# Patient Record
Sex: Male | Born: 1970 | Race: White | Hispanic: No | Marital: Single | Smoking: Current some day smoker
Health system: Southern US, Community
[De-identification: ages and names within clinical notes are randomized; demographics above are authoritative.]

## PROBLEM LIST (undated history)

## (undated) DIAGNOSIS — M1990 Unspecified osteoarthritis, unspecified site: Secondary | ICD-10-CM

## (undated) DIAGNOSIS — H9191 Unspecified hearing loss, right ear: Secondary | ICD-10-CM

## (undated) DIAGNOSIS — Z789 Other specified health status: Secondary | ICD-10-CM

## (undated) DIAGNOSIS — S46012A Strain of muscle(s) and tendon(s) of the rotator cuff of left shoulder, initial encounter: Secondary | ICD-10-CM

## (undated) DIAGNOSIS — Z87442 Personal history of urinary calculi: Secondary | ICD-10-CM

## (undated) DIAGNOSIS — M67814 Other specified disorders of tendon, left shoulder: Secondary | ICD-10-CM

## (undated) HISTORY — PX: SHOULDER OPEN ROTATOR CUFF REPAIR: SHX2407

## (undated) HISTORY — PX: KNEE ARTHROSCOPY: SHX127

---

## 2006-03-05 ENCOUNTER — Emergency Department: Payer: Self-pay | Admitting: Emergency Medicine

## 2006-03-05 IMAGING — CR LEFT MIDDLE FINGER 2+V
1 series · 3 of 3 positions shown · non-contrast
Comparison: none

REASON FOR EXAM: -mc4
COMMENTS:

PROCEDURE:     DXR - DXR FINGER MID 3RD DIGIT LT HAND  - [DATE]  [DATE]
RESULT:     No acute bony or joint abnormalities identified. There is no
evidence of fracture or dislocation.

[Series 1: view not recorded · 0.17mm/px · 3 of 3 slices shown]
[im 1/3]
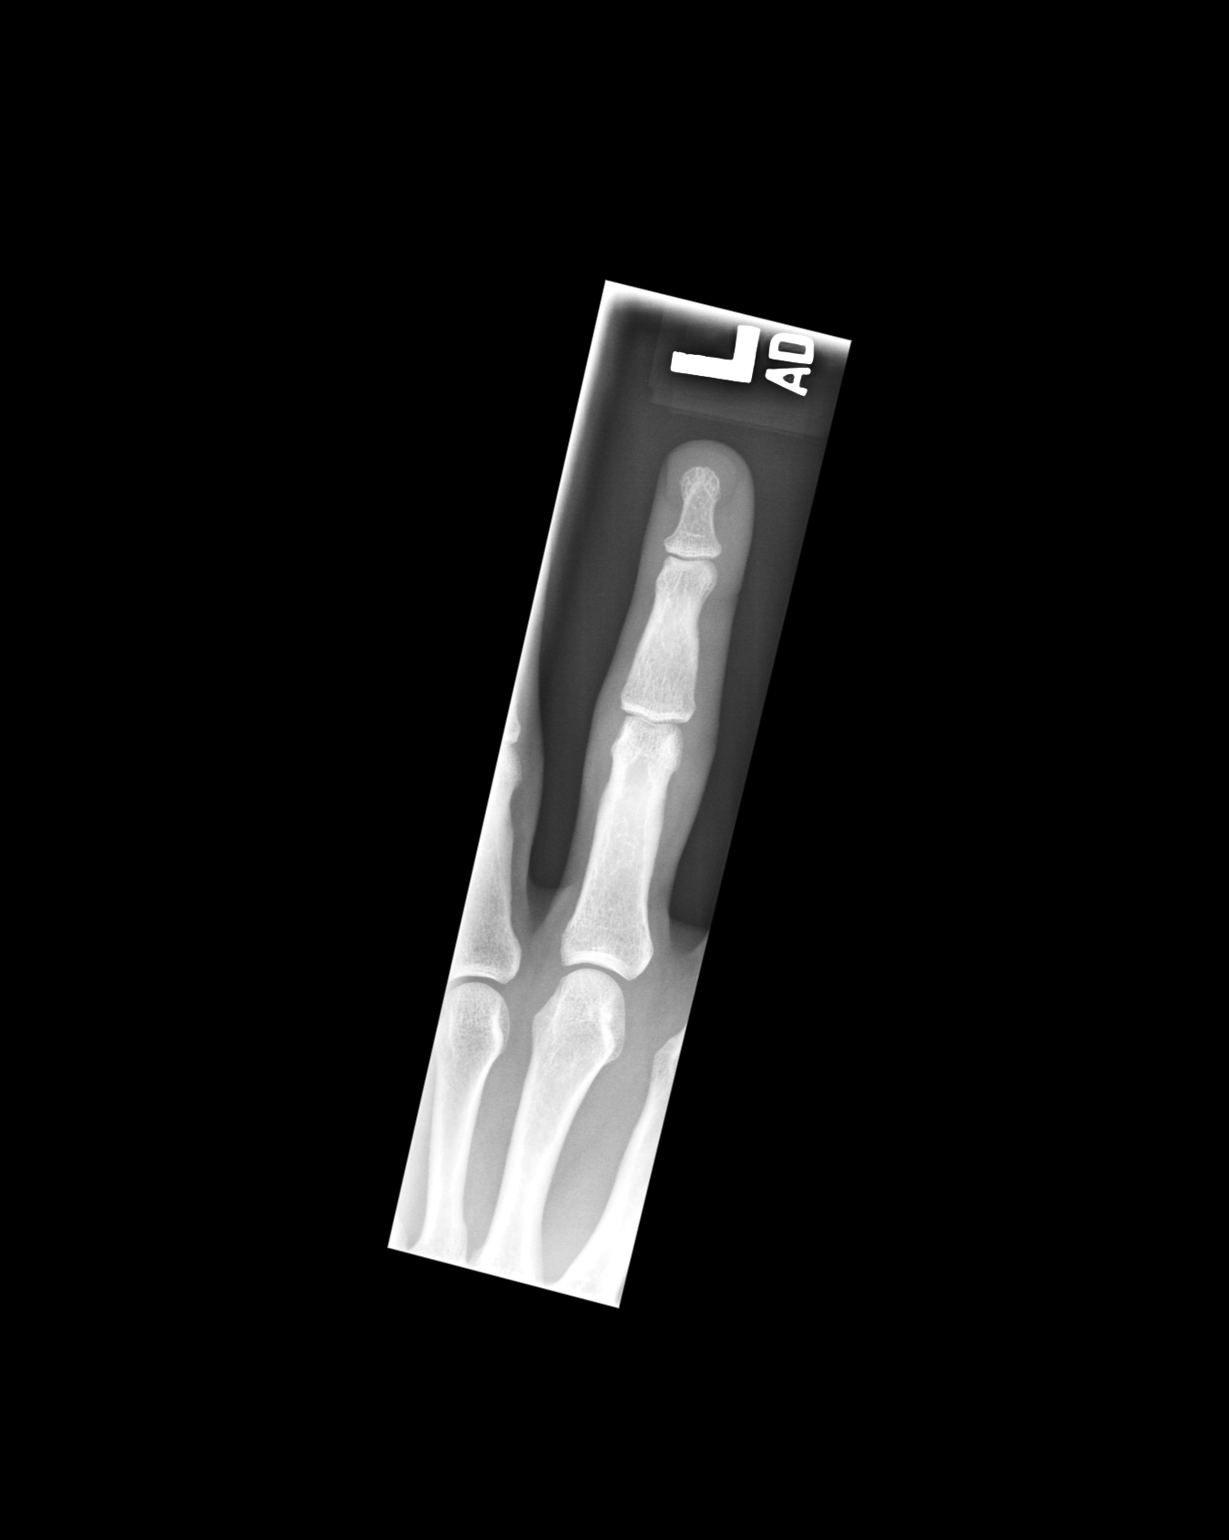
[im 2/3]
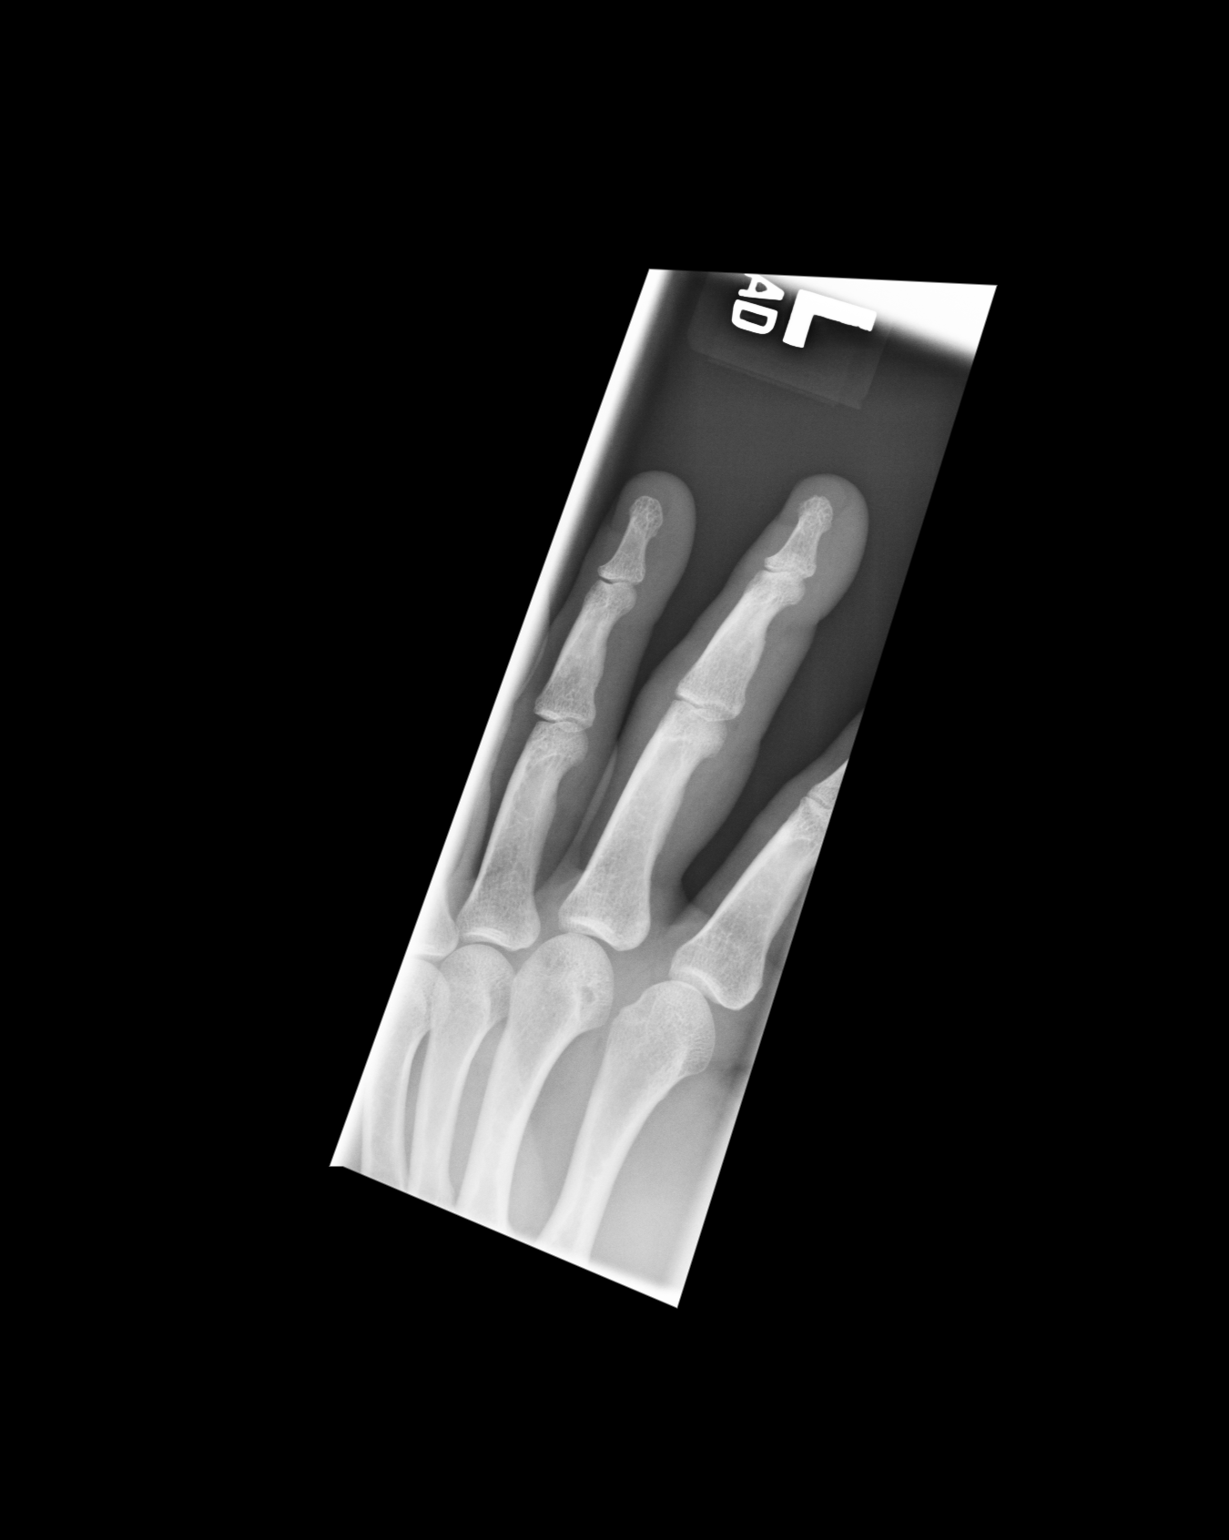
[im 3/3]
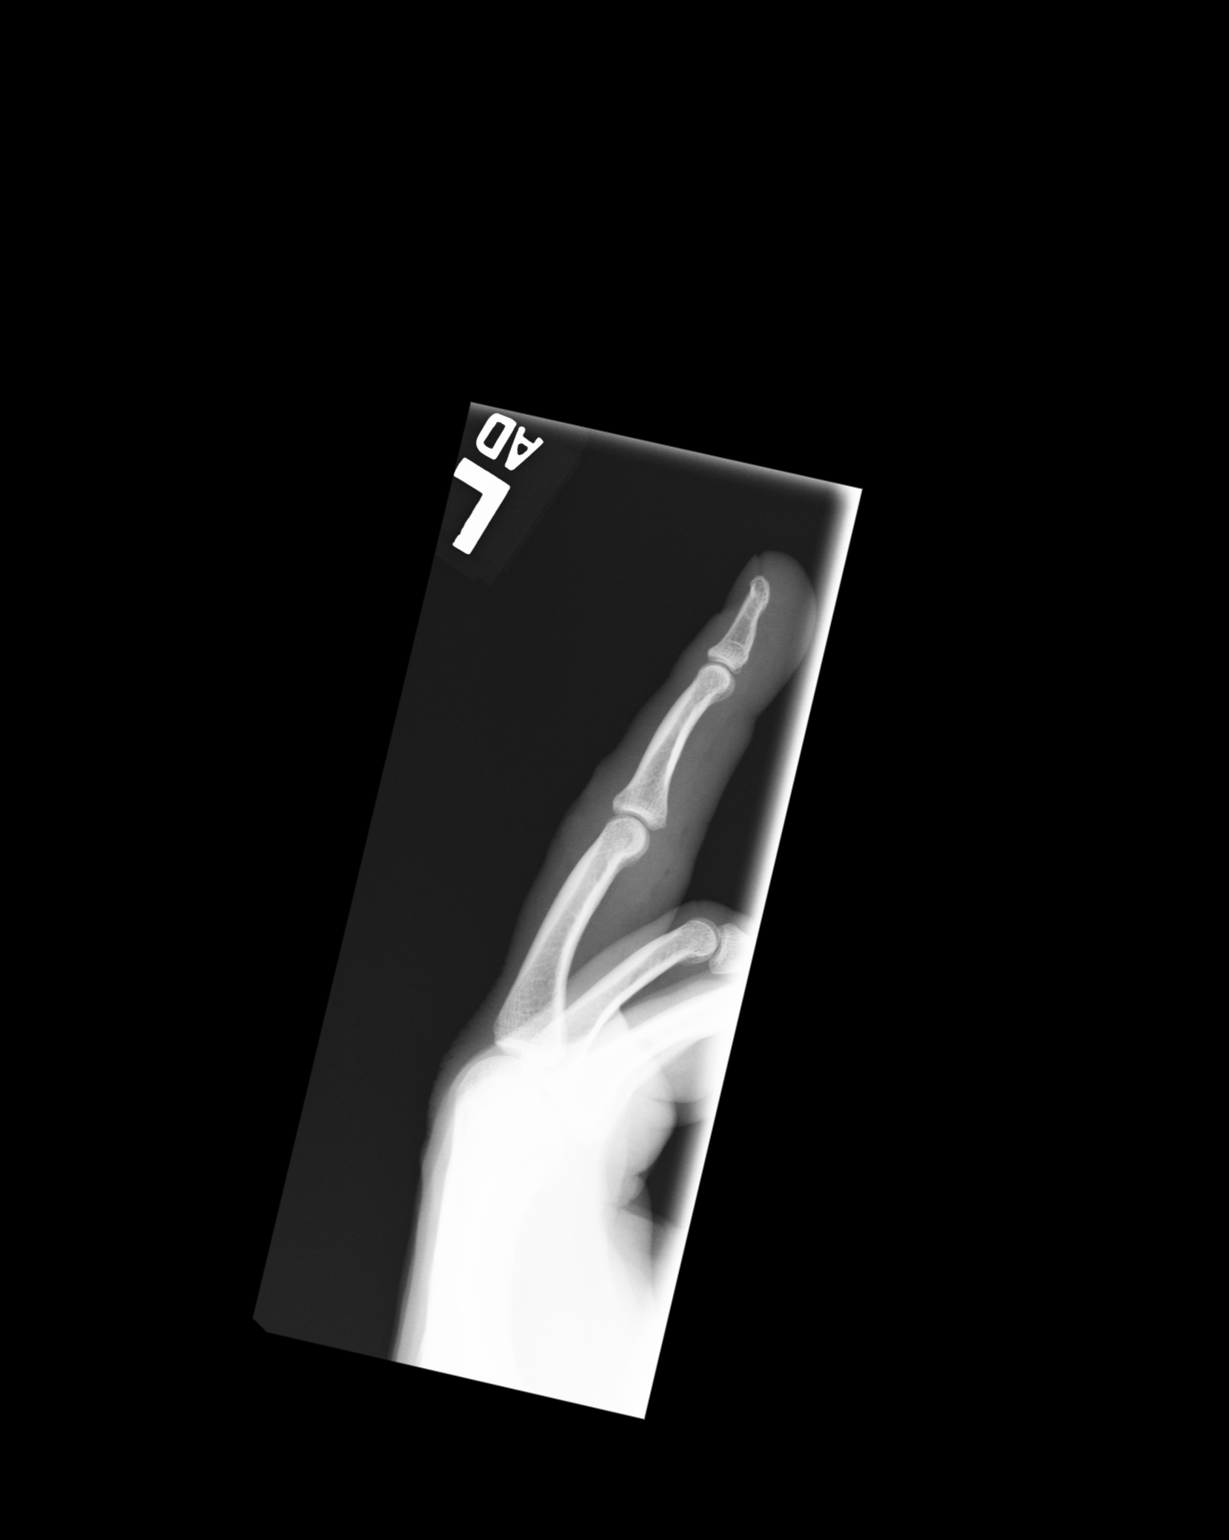

[3 of 3 positions shown; findings below may reference images not displayed]

IMPRESSION: 1)No acute abnormalities.

## 2014-06-26 ENCOUNTER — Emergency Department: Payer: Self-pay | Admitting: Emergency Medicine

## 2014-06-26 IMAGING — CT CT ABD-PELV W/ CM
2 of 5 series · 16 of 46 positions shown, 18 images · IV contrast (omnipaque)
Comparison: None.

CLINICAL DATA: Right flank pain and right lower quadrant pain

EXAM:
CT ABDOMEN AND PELVIS WITH CONTRAST
TECHNIQUE: Multidetector CT imaging of the abdomen and pelvis was performed
using the standard protocol following bolus administration of
intravenous contrast.
CONTRAST:  100 mL Omnipaque 300 intravenous

[Series 2: routine abd pel with · axial · 0.75mm/px · z∈[-550,-100]mm · 13 of 102 slices shown, 15 images]
[im 6/102  soft-tissue]
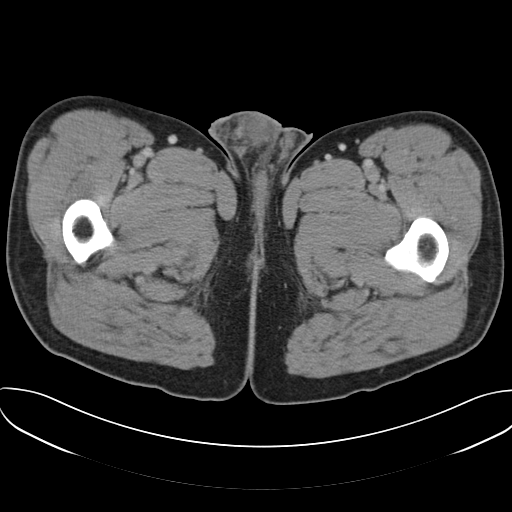
[im 6/102  bone]
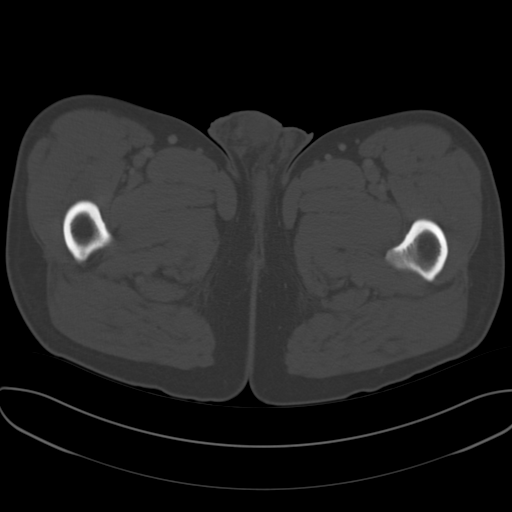
[im 16/102  soft-tissue]
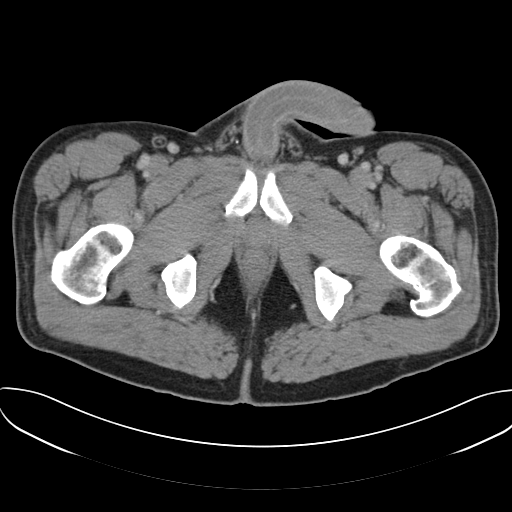
[im 22/102  soft-tissue]
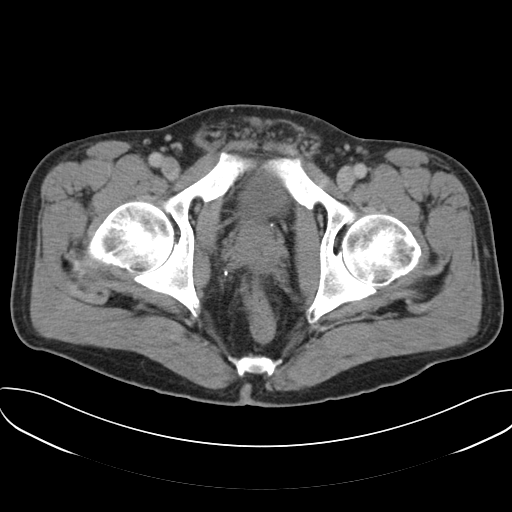
[im 27/102  soft-tissue]
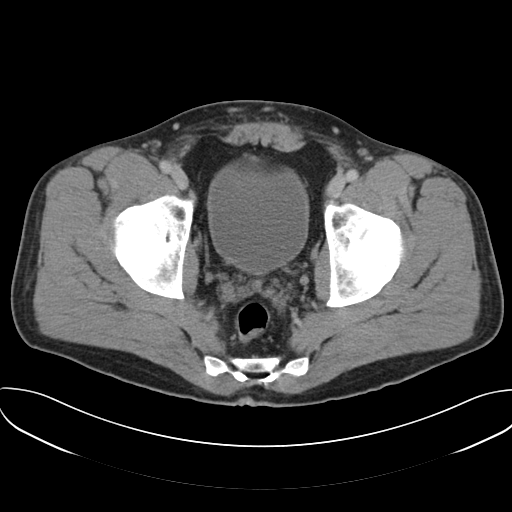
[im 38/102  soft-tissue]
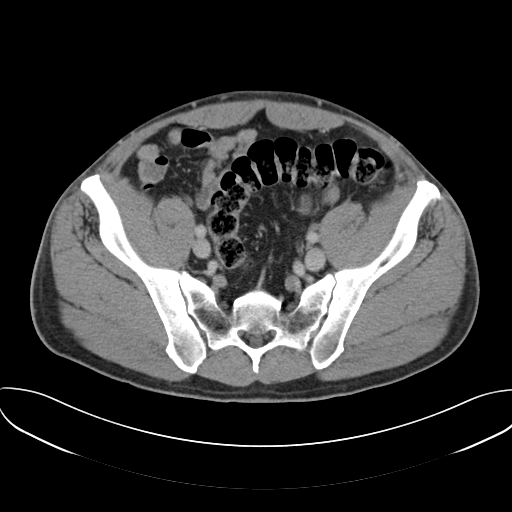
[im 43/102  soft-tissue]
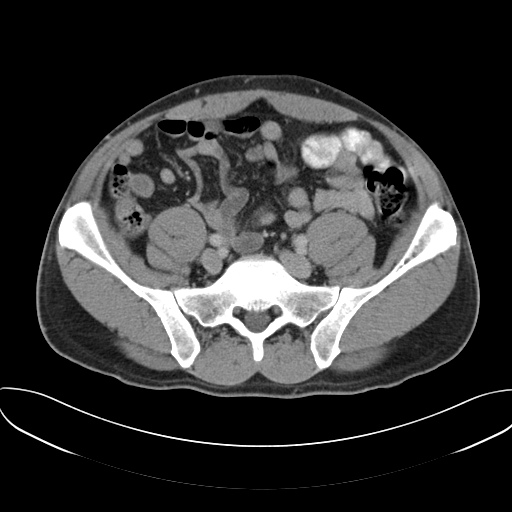
[im 54/102  soft-tissue]
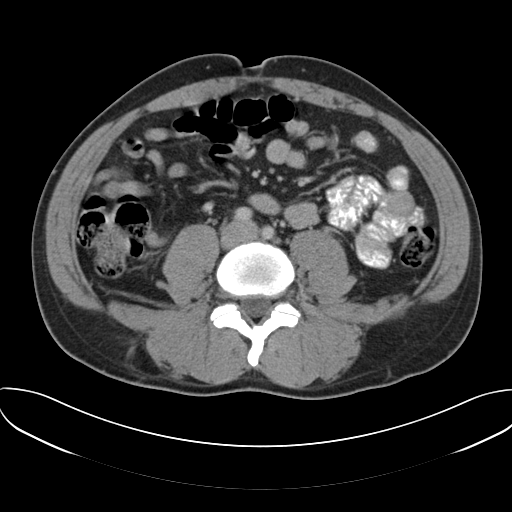
[im 59/102  soft-tissue]
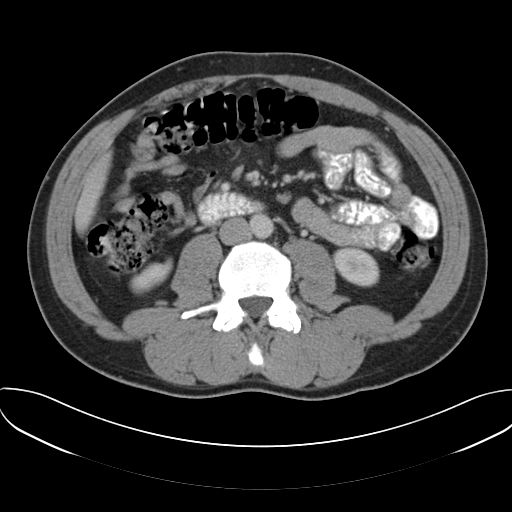
[im 64/102  soft-tissue]
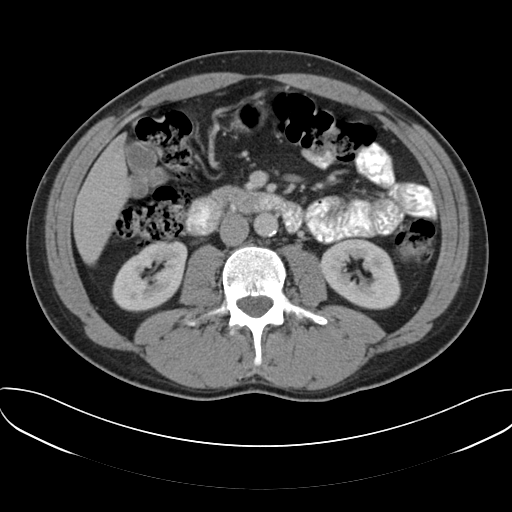
[im 64/102  bone]
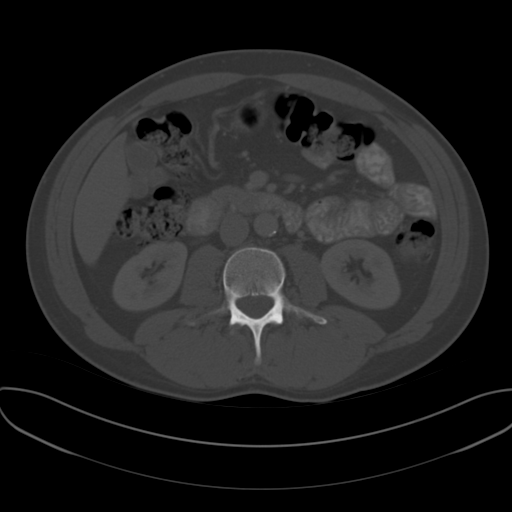
[im 75/102  soft-tissue]
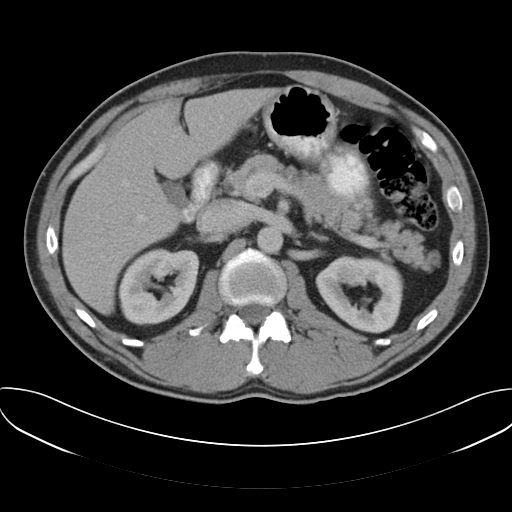
[im 80/102  soft-tissue]
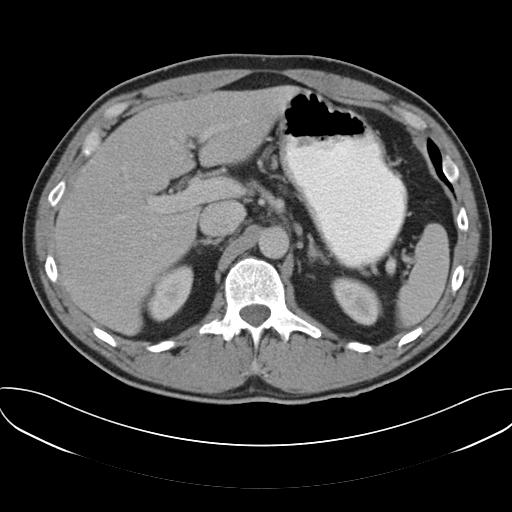
[im 86/102  soft-tissue]
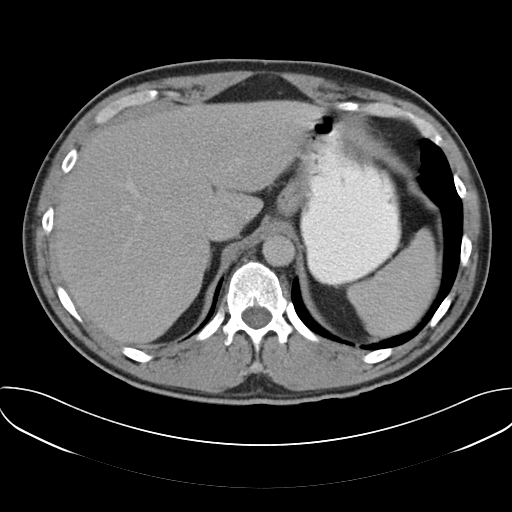
[im 96/102  soft-tissue]
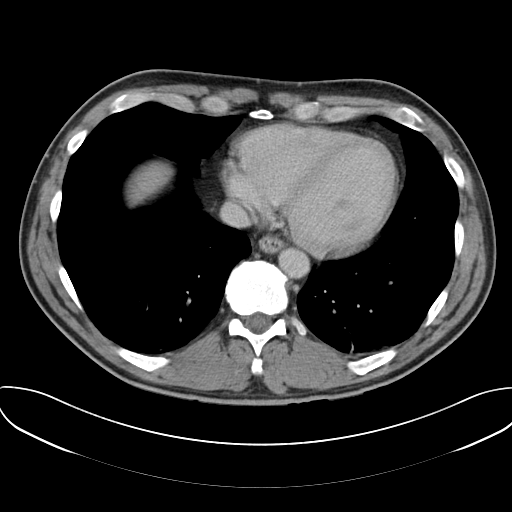

[Series 5: cor routine abd pel with · coronal · 0.72mm/px · 3 of 126 slices shown]
[im 42/126  soft-tissue]
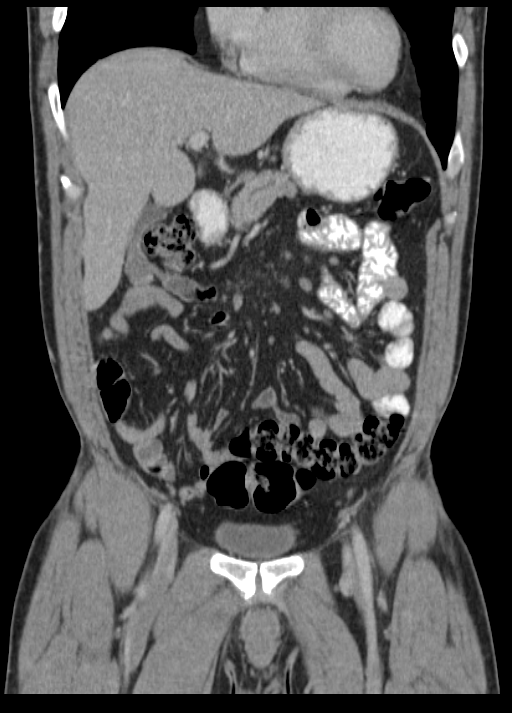
[im 56/126  soft-tissue]
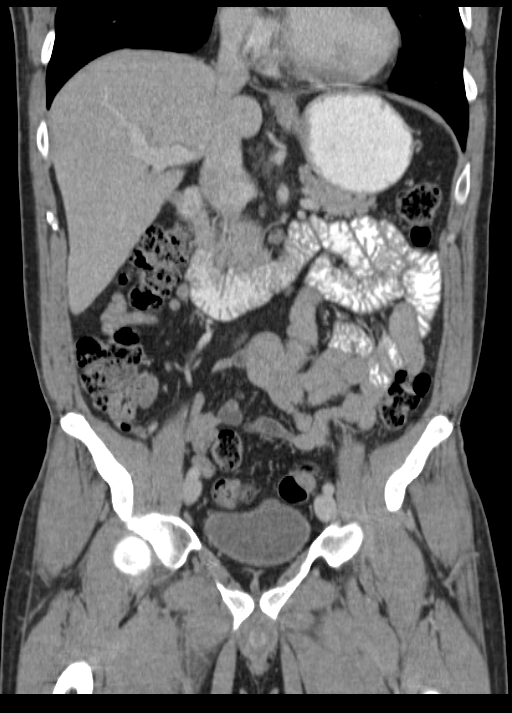
[im 70/126  soft-tissue]
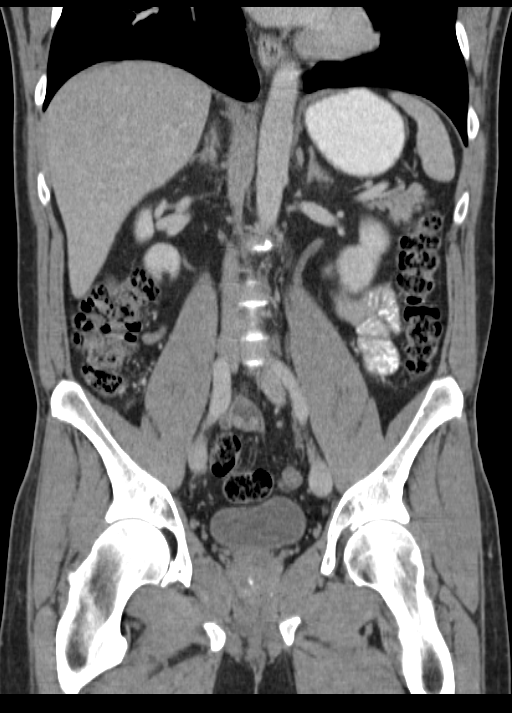

[16 of 46 positions shown; findings below may reference images not displayed]

FINDINGS: Lower chest: Mild linear scar-like opacity in the left posterior
base

Hepatobiliary: There are normal appearances of the liver,
gallbladder and bile ducts.

Pancreas: Normal

Spleen: Normal except for a sub cm cyst posterolaterally.

Adrenals/Urinary Tract: The adrenals and kidneys are normal in
appearance. There is no urinary calculus evident. There is no
hydronephrosis or ureteral dilatation. Collecting systems and
ureters appear unremarkable.

Stomach/Bowel: There are normal appearances of the stomach, small
bowel and colon. The appendix is normal.

Vascular/Lymphatic: The abdominal aorta is normal in caliber. There
is no atherosclerotic calcification. There is no adenopathy in the
abdomen or pelvis.

Other: No acute inflammatory changes are evident in the abdomen or
pelvis. There is no ascites.

Musculoskeletal: No significant abnormality
IMPRESSION: No significant abnormality

## 2016-03-25 ENCOUNTER — Other Ambulatory Visit: Payer: Self-pay | Admitting: Specialist

## 2016-03-28 ENCOUNTER — Encounter
Admission: RE | Admit: 2016-03-28 | Discharge: 2016-03-28 | Disposition: A | Discharge status: Home / Self Care | Payer: BLUE CROSS/BLUE SHIELD | Source: Ambulatory Visit | Attending: Specialist | Admitting: Specialist

## 2016-03-28 ENCOUNTER — Encounter: Payer: Self-pay | Admitting: *Deleted

## 2016-03-28 NOTE — Patient Instructions (Signed)
  Your procedure is scheduled on: 03-31-16 Report to Same Day Surgery 2nd floor medical mall [facility](Medical Mall Entrance-take elevator on left to 2nd floor.  Check in with surgery information desk.) To find out your arrival time please call [phone] between 1PM - 3PM on 03-28-16  Remember: Instructions that are not followed completely may result in serious medical risk, up to and including death, or upon the discretion of your surgeon and anesthesiologist your surgery may need to be rescheduled.    _x___ 1. Do not eat food or drink liquids after midnight. No gum chewing or hard candies.     __x__ 2. No Alcohol for 24 hours before or after surgery.   __x__3. No Smoking for 24 prior to surgery.   ____  4. Bring all medications with you on the day of surgery if instructed.    __x__ 5. Notify your doctor if there is any change in your medical condition     (cold, fever, infections).     Do not wear jewelry, make-up, hairpins, clips or nail polish.  Do not wear lotions, powders, or perfumes. You may wear deodorant.  Do not shave 48 hours prior to surgery. Men may shave face and neck.  Do not bring valuables to the hospital.    [facility] belongings or valuables.               Contacts, dentures or bridgework may not be worn into surgery.  Leave your suitcase in the car. After surgery it may be brought to your room.  For patients admitted to the hospital, discharge time is determined by your treatment team.   Patients discharged the day of surgery will not be allowed to drive home.  You will need someone to drive you home and stay with you the night of your procedure.    Please read over the following fact sheets that you were given:   [facility] Preparing [facility] and or MRSA Information   ____ Take these medicines the morning of surgery with A SIP OF WATER:    1. NONE  2.  3.  4.  5.  6.  ____Fleets enema or Magnesium Citrate as directed.   ____  Use CHG Soap or sage wipes as directed on instruction sheet   ____ Use inhalers on the day of surgery and bring to hospital day of surgery  ____ Stop metformin 2 days prior to surgery    ____ Take 1/2 of usual insulin dose the night before surgery and none on the morning of  surgery.   ____ Stop Aspirin, Coumadin, Pllavix ,Eliquis, Effient, or Pradaxa  x__ Stop Anti-inflammatories such as Advil, Aleve, Ibuprofen, Motrin, Naproxen,          Naprosyn, Goodies powders or aspirin products. Ok to take Tylenol.   ____ Stop supplements until after surgery.    ____ Bring C-Pap to the hospital.

## 2016-03-31 ENCOUNTER — Encounter: Payer: Self-pay | Admitting: *Deleted

## 2016-03-31 ENCOUNTER — Ambulatory Visit: Payer: BLUE CROSS/BLUE SHIELD | Admitting: Certified Registered Nurse Anesthetist

## 2016-03-31 ENCOUNTER — Encounter
Admission: RE | Disposition: A | Discharge status: Home / Self Care | Payer: Self-pay | Source: Ambulatory Visit | Attending: Specialist

## 2016-03-31 ENCOUNTER — Ambulatory Visit
Admission: RE | Admit: 2016-03-31 | Discharge: 2016-03-31 | Disposition: A | Discharge status: Home / Self Care | Payer: BLUE CROSS/BLUE SHIELD | Source: Ambulatory Visit | Attending: Specialist | Admitting: Specialist

## 2016-03-31 DIAGNOSIS — F17210 Nicotine dependence, cigarettes, uncomplicated: Secondary | ICD-10-CM | POA: Insufficient documentation

## 2016-03-31 DIAGNOSIS — M7701 Medial epicondylitis, right elbow: Secondary | ICD-10-CM | POA: Diagnosis not present

## 2016-03-31 HISTORY — PX: TENNIS ELBOW RELEASE/NIRSCHEL PROCEDURE: SHX6651

## 2016-03-31 HISTORY — DX: Other specified health status: Z78.9

## 2016-03-31 SURGERY — TENNIS ELBOW RELEASE/NIRSCHEL PROCEDURE
Anesthesia: General | Site: Elbow | Laterality: Right | Wound class: Clean

## 2016-03-31 MED ORDER — ACETAMINOPHEN 10 MG/ML IV SOLN
INTRAVENOUS | Status: DC | PRN
  Administered 2016-03-31: 1000 mg via INTRAVENOUS

## 2016-03-31 MED ORDER — FAMOTIDINE 20 MG PO TABS
20.0000 mg | ORAL_TABLET | Freq: Once | ORAL | Status: AC
  Administered 2016-03-31: 20 mg via ORAL

## 2016-03-31 MED ORDER — LACTATED RINGERS IV SOLN
INTRAVENOUS | Status: DC
  Administered 2016-03-31: 16:00:00 via INTRAVENOUS

## 2016-03-31 MED ORDER — MIDAZOLAM HCL 2 MG/2ML IJ SOLN
INTRAMUSCULAR | Status: DC | PRN
  Administered 2016-03-31: 2 mg via INTRAVENOUS

## 2016-03-31 MED ORDER — MELOXICAM 7.5 MG PO TABS
15.0000 mg | ORAL_TABLET | Freq: Once | ORAL | Status: AC
  Administered 2016-03-31: 15 mg via ORAL

## 2016-03-31 MED ORDER — MELOXICAM 7.5 MG PO TABS
ORAL_TABLET | ORAL | Status: AC
  Filled 2016-03-31: qty 2

## 2016-03-31 MED ORDER — PROMETHAZINE HCL 25 MG/ML IJ SOLN
6.2500 mg | INTRAMUSCULAR | Status: DC | PRN
Start: 2016-03-31 — End: 2016-03-31

## 2016-03-31 MED ORDER — NEOMYCIN-POLYMYXIN B GU 40-200000 IR SOLN
Status: AC
  Filled 2016-03-31: qty 2

## 2016-03-31 MED ORDER — CHLORHEXIDINE GLUCONATE CLOTH 2 % EX PADS: 6.0000 | MEDICATED_PAD | Freq: Once | CUTANEOUS | Status: DC

## 2016-03-31 MED ORDER — CLINDAMYCIN PHOSPHATE 600 MG/50ML IV SOLN
INTRAVENOUS | Status: AC
  Filled 2016-03-31: qty 50

## 2016-03-31 MED ORDER — BUPIVACAINE HCL (PF) 0.5 % IJ SOLN
INTRAMUSCULAR | Status: DC | PRN
  Administered 2016-03-31: 30 mL

## 2016-03-31 MED ORDER — CEFAZOLIN SODIUM-DEXTROSE 2-4 GM/100ML-% IV SOLN
INTRAVENOUS | Status: AC
  Filled 2016-03-31: qty 100

## 2016-03-31 MED ORDER — GABAPENTIN 400 MG PO CAPS
ORAL_CAPSULE | ORAL | Status: AC
  Filled 2016-03-31: qty 1

## 2016-03-31 MED ORDER — FAMOTIDINE 20 MG PO TABS
ORAL_TABLET | ORAL | Status: AC
  Filled 2016-03-31: qty 1

## 2016-03-31 MED ORDER — BUPIVACAINE HCL (PF) 0.5 % IJ SOLN
INTRAMUSCULAR | Status: AC
  Filled 2016-03-31: qty 30

## 2016-03-31 MED ORDER — ACETAMINOPHEN 10 MG/ML IV SOLN
INTRAVENOUS | Status: AC
  Filled 2016-03-31: qty 100

## 2016-03-31 MED ORDER — FENTANYL CITRATE (PF) 100 MCG/2ML IJ SOLN
INTRAMUSCULAR | Status: DC | PRN
  Administered 2016-03-31 (×2): 50 ug via INTRAVENOUS

## 2016-03-31 MED ORDER — FENTANYL CITRATE (PF) 100 MCG/2ML IJ SOLN: 25.0000 ug | INTRAMUSCULAR | Status: DC | PRN

## 2016-03-31 MED ORDER — PROPOFOL 10 MG/ML IV BOLUS
INTRAVENOUS | Status: DC | PRN
  Administered 2016-03-31: 200 mg via INTRAVENOUS

## 2016-03-31 MED ORDER — GABAPENTIN 400 MG PO CAPS: 400.0000 mg | ORAL_CAPSULE | Freq: Two times a day (BID) | ORAL | 3 refills | Status: DC

## 2016-03-31 MED ORDER — CLINDAMYCIN PHOSPHATE 600 MG/50ML IV SOLN
600.0000 mg | INTRAVENOUS | Status: AC
  Administered 2016-03-31: 600 mg via INTRAVENOUS

## 2016-03-31 MED ORDER — MEPERIDINE HCL 25 MG/ML IJ SOLN: 6.2500 mg | INTRAMUSCULAR | Status: DC | PRN

## 2016-03-31 MED ORDER — OXYCODONE HCL 5 MG PO TABS: 5.0000 mg | ORAL_TABLET | Freq: Once | ORAL | Status: DC | PRN

## 2016-03-31 MED ORDER — NEOMYCIN-POLYMYXIN B GU 40-200000 IR SOLN
Status: DC | PRN
  Administered 2016-03-31: 2 mL

## 2016-03-31 MED ORDER — CEFAZOLIN SODIUM-DEXTROSE 2-4 GM/100ML-% IV SOLN
2.0000 g | INTRAVENOUS | Status: AC
  Administered 2016-03-31: 2 g via INTRAVENOUS

## 2016-03-31 MED ORDER — LIDOCAINE HCL (CARDIAC) 20 MG/ML IV SOLN
INTRAVENOUS | Status: DC | PRN
  Administered 2016-03-31: 50 mg via INTRAVENOUS

## 2016-03-31 MED ORDER — OXYCODONE HCL 5 MG/5ML PO SOLN: 5.0000 mg | Freq: Once | ORAL | Status: DC | PRN

## 2016-03-31 MED ORDER — GABAPENTIN 400 MG PO CAPS
400.0000 mg | ORAL_CAPSULE | Freq: Once | ORAL | Status: AC
  Administered 2016-03-31: 400 mg via ORAL

## 2016-03-31 MED ORDER — ONDANSETRON HCL 4 MG/2ML IJ SOLN
INTRAMUSCULAR | Status: DC | PRN
  Administered 2016-03-31: 4 mg via INTRAVENOUS

## 2016-03-31 MED ORDER — HYDROCODONE-ACETAMINOPHEN 7.5-325 MG PO TABS: 1.0000 | ORAL_TABLET | Freq: Four times a day (QID) | ORAL | 0 refills | Status: DC | PRN

## 2016-03-31 MED ORDER — MELOXICAM 15 MG PO TABS: 15.0000 mg | ORAL_TABLET | Freq: Every day | ORAL | 3 refills | Status: DC

## 2016-03-31 MED ORDER — DEXAMETHASONE SODIUM PHOSPHATE 10 MG/ML IJ SOLN
INTRAMUSCULAR | Status: DC | PRN
  Administered 2016-03-31: 10 mg via INTRAVENOUS

## 2016-03-31 SURGICAL SUPPLY — 34 items
BNDG COHESIVE 4X5 TAN STRL (GAUZE/BANDAGES/DRESSINGS) ×3 IMPLANT
BNDG ESMARK 4X12 TAN STRL LF (GAUZE/BANDAGES/DRESSINGS) ×3 IMPLANT
CANISTER SUCT 1200ML W/VALVE (MISCELLANEOUS) ×3 IMPLANT
CHLORAPREP W/TINT 26ML (MISCELLANEOUS) ×3 IMPLANT
CLOSURE WOUND 1/2 X4 (GAUZE/BANDAGES/DRESSINGS) ×1
ELECT REM PT RETURN 9FT ADLT (ELECTROSURGICAL) ×3
ELECTRODE REM PT RTRN 9FT ADLT (ELECTROSURGICAL) ×1 IMPLANT
GAUZE PETRO XEROFOAM 1X8 (MISCELLANEOUS) ×3 IMPLANT
GAUZE SPONGE 4X4 12PLY STRL (GAUZE/BANDAGES/DRESSINGS) ×3 IMPLANT
GLOVE BIO SURGEON STRL SZ8 (GLOVE) ×3 IMPLANT
KIT RM TURNOVER STRD PROC AR (KITS) ×3 IMPLANT
LOOP VESSEL SUPERMAXI WHITE (MISCELLANEOUS) ×3 IMPLANT
NDL MAYO CATGUT SZ5 (NEEDLE)
NDL SUT 5 .5 CRC TROC PNT MAYO (NEEDLE) IMPLANT
NEEDLE SPNL 20GX3.5 QUINCKE YW (NEEDLE) ×3 IMPLANT
NS IRRIG 500ML POUR BTL (IV SOLUTION) ×3 IMPLANT
PACK EXTREMITY ARMC (MISCELLANEOUS) ×3 IMPLANT
PADDING CAST 4IN STRL (MISCELLANEOUS) ×4
PADDING CAST BLEND 4X4 STRL (MISCELLANEOUS) ×2 IMPLANT
SLING ARM LRG DEEP (SOFTGOODS) ×3 IMPLANT
SPLINT CAST 1 STEP 4X30 (MISCELLANEOUS) ×3 IMPLANT
SPONGE LAP 18X18 5 PK (GAUZE/BANDAGES/DRESSINGS) ×3 IMPLANT
STAPLER SKIN PROX 35W (STAPLE) ×3 IMPLANT
STOCKINETTE BIAS CUT 6 980064 (GAUZE/BANDAGES/DRESSINGS) ×3 IMPLANT
STOCKINETTE IMPERVIOUS 9X36 MD (GAUZE/BANDAGES/DRESSINGS) ×3 IMPLANT
STRIP CLOSURE SKIN 1/2X4 (GAUZE/BANDAGES/DRESSINGS) ×2 IMPLANT
SUT ETHIBOND CT1 BRD #0 30IN (SUTURE) IMPLANT
SUT ETHILON 4-0 (SUTURE)
SUT ETHILON 4-0 FS2 18XMFL BLK (SUTURE)
SUT PROLENE 3 0 PS 2 (SUTURE) IMPLANT
SUT VIC AB 2-0 CT1 (SUTURE) IMPLANT
SUT VIC AB 3-0 SH 27 (SUTURE)
SUT VIC AB 3-0 SH 27X BRD (SUTURE) IMPLANT
SUTURE ETHLN 4-0 FS2 18XMF BLK (SUTURE) IMPLANT

## 2016-03-31 NOTE — OR Nursing (Signed)
Clipper prep prep Frances MaywoodM Bennett mid upper to mid lower right arm as per Dr. Rondel BatonMiller's instructions.  Order for attestation for blood products cancelled as instructed by Dr. Hyacinth MeekerMiller.

## 2016-03-31 NOTE — Anesthesia Procedure Notes (Signed)
Procedure Name: LMA Insertion Date/Time: 03/31/2016 5:10 PM Performed by: Ginger CarneMICHELET, Wyatt Galvan Pre-anesthesia Checklist: Patient identified, Emergency Drugs available, Suction available, Patient being monitored and Timeout performed Patient Re-evaluated:Patient Re-evaluated prior to inductionOxygen Delivery Method: Circle system utilized Preoxygenation: Pre-oxygenation with 100% oxygen Intubation Type: IV induction LMA: LMA inserted LMA Size: 4.5 Tube type: Oral Number of attempts: 1 Placement Confirmation: positive ETCO2 and breath sounds checked- equal and bilateral Tube secured with: Tape Dental Injury: Teeth and Oropharynx as per pre-operative assessment

## 2016-03-31 NOTE — Op Note (Signed)
03/31/2016  5:59 PM  PATIENT:  Todd Hancock  45 y.o. male  PRE-OPERATIVE DIAGNOSIS:  M77.01 Medial epicondylitis, right elbow  POST-OPERATIVE DIAGNOSIS:  Same  PROCEDURE:   MEDIAL RELEASE RIGHT ELBOW  SURGEON:  Keeon Zurn E Clancey Welton MD  ASST:    ANESTHESIA:   General  EBL:  NONE  TOURNIQUET TIME:  31 MIN  OPERATIVE FINDINGS:  GRANULATION TISSUE AT FCR AND PRONATOR ORIGIN  OPERATIVE PROCEDURE:  The patient was brought to the operating room and underwent satisfactory general LMA anesthesia in the supine position. The arm was prepped and draped in a sterile fashion and Esmarch applied. Tourniquet was inflated to 250 mmHg. Slightly curved the posterior medial incision was made just behind the upper condyle. Dissection was carried on blood was changed tissue. Some changes veins and nerves were protected. Medial epicondyle region was opened. The interval between the radiologist and pronator was opened. This was followed proximally to the medial epicondyle. There was a large quantity of gray, shiny, granulation tissue in this area. This was excised sharply. This was carried out onto the medial epicondyle itself. A ronguer was used to remove bone from the  epicondyle. The wound was irrigated and the fascia closed with 2-0 Vicryl suture. The ulnar nerve did not appear to be under any undue tension and move freely to palpation. I elected not to decompress this. The subcutaneous tissue was closed with 3-0 Vicryl and the skin was closed with staples. 1/2% Marcaine was placed in the wound. Dry sterile dressing were applied along with a sugar tong splint. Tourniquet was deflated with good return of blood flow to the hand. Patient was awakened and taken to recovery in good condition.  Valinda HoarHOWARD E Myles Mallicoat, MD

## 2016-03-31 NOTE — Transfer of Care (Signed)
Immediate Anesthesia Transfer of Care Note  Patient: Todd Hancock C Vankuren  Procedure(s) Performed: Procedure(s): TENNIS ELBOW RELEASE/NIRSCHEL PROCEDURE (Right)  Patient Location: PACU  Anesthesia Type:General  Level of Consciousness: sedated  Airway & Oxygen Therapy: Patient Spontanous Breathing and Patient connected to face mask oxygen  Post-op Assessment: Report given to RN and Post -op Vital signs reviewed and stable  Post vital signs: Reviewed and stable  Last Vitals:  Vitals:   03/31/16 1540  BP: (!) 145/102  Pulse: 69  Resp: 18  Temp: 36.9 C    Last Pain:  Vitals:   03/31/16 1540  TempSrc: Tympanic         Complications: No apparent anesthesia complications

## 2016-03-31 NOTE — H&P (Signed)
THE PATIENT WAS SEEN PRIOR TO SURGERY TODAY.  HISTORY, ALLERGIES, HOME MEDICATIONS AND OPERATIVE PROCEDURE WERE REVIEWED. RISKS AND BENEFITS OF SURGERY DISCUSSED WITH PATIENT AGAIN.  NO CHANGES FROM INITIAL HISTORY AND PHYSICAL NOTED.    

## 2016-03-31 NOTE — Anesthesia Postprocedure Evaluation (Signed)
Anesthesia Post Note  Patient: Todd Hancock  Procedure(s) Performed: Procedure(s) (LRB): TENNIS ELBOW RELEASE/NIRSCHEL PROCEDURE (Right)  Patient location during evaluation: PACU Anesthesia Type: General Level of consciousness: awake and alert and oriented Pain management: pain level controlled Vital Signs Assessment: post-procedure vital signs reviewed and stable Respiratory status: spontaneous breathing, nonlabored ventilation and respiratory function stable Cardiovascular status: blood pressure returned to baseline and stable Postop Assessment: no signs of nausea or vomiting Anesthetic complications: no     Last Vitals:  Vitals:   03/31/16 1821 03/31/16 1836  BP: (!) 143/104 (!) 152/98  Pulse: 70 65  Resp: 16 20  Temp:      Last Pain:  Vitals:   03/31/16 1836  TempSrc:   PainSc: 0-No pain                 Florina Glas

## 2016-03-31 NOTE — OR Nursing (Signed)
abx x 2 to be given in OR No ord for clipper prep - Frances MaywoodM Bennett, RN will ask MD when in to see pt No attestation for procedure, just blood, Frances MaywoodM Bennett, RN will confirm with MD when in to see pt and will cx attestation for blood if need be, and ask MD to to do attestation for procedure.

## 2016-03-31 NOTE — Anesthesia Preprocedure Evaluation (Signed)
Anesthesia Evaluation  Patient identified by MRN, date of birth, ID band Patient awake    Reviewed: Allergy & Precautions, NPO status , Patient's Chart, lab work & pertinent test results  History of Anesthesia Complications Negative for: history of anesthetic complications  Airway Mallampati: III  TM Distance: >3 FB Neck ROM: Full    Dental  (+) Poor Dentition   Pulmonary neg sleep apnea, neg COPD, Current Smoker,    breath sounds clear to auscultation- rhonchi (-) wheezing      Cardiovascular Exercise Tolerance: Good (-) hypertension(-) CAD and (-) Past MI  Rhythm:Regular Rate:Normal - Systolic murmurs and - Diastolic murmurs    Neuro/Psych negative neurological ROS  negative psych ROS   GI/Hepatic negative GI ROS, Neg liver ROS,   Endo/Other  negative endocrine ROSneg diabetes  Renal/GU negative Renal ROS     Musculoskeletal negative musculoskeletal ROS (+)   Abdominal (+) - obese,   Peds  Hematology negative hematology ROS (+)   Anesthesia Other Findings    Reproductive/Obstetrics                             Anesthesia Physical Anesthesia Plan  ASA: II  Anesthesia Plan: General   Post-op Pain Management:    Induction: Intravenous  Airway Management Planned: LMA  Additional Equipment:   Intra-op Plan:   Post-operative Plan:   Informed Consent: I have reviewed the patients History and Physical, chart, labs and discussed the procedure including the risks, benefits and alternatives for the proposed anesthesia with the patient or authorized representative who has indicated his/her understanding and acceptance.   Dental advisory given  Plan Discussed with: CRNA and Anesthesiologist  Anesthesia Plan Comments:         Anesthesia Quick Evaluation

## 2016-03-31 NOTE — Discharge Instructions (Signed)

## 2016-04-01 ENCOUNTER — Encounter: Payer: Self-pay | Admitting: Specialist

## 2018-08-29 ENCOUNTER — Encounter: Payer: Self-pay | Admitting: Emergency Medicine

## 2018-08-29 ENCOUNTER — Emergency Department: Payer: BLUE CROSS/BLUE SHIELD

## 2018-08-29 ENCOUNTER — Other Ambulatory Visit: Payer: Self-pay

## 2018-08-29 ENCOUNTER — Emergency Department
Admission: EM | Admit: 2018-08-29 | Discharge: 2018-08-29 | Disposition: A | Discharge status: Home / Self Care | Payer: BLUE CROSS/BLUE SHIELD | Attending: Emergency Medicine | Admitting: Emergency Medicine

## 2018-08-29 DIAGNOSIS — Y9389 Activity, other specified: Secondary | ICD-10-CM | POA: Diagnosis not present

## 2018-08-29 DIAGNOSIS — S61511A Laceration without foreign body of right wrist, initial encounter: Secondary | ICD-10-CM | POA: Insufficient documentation

## 2018-08-29 DIAGNOSIS — Y998 Other external cause status: Secondary | ICD-10-CM | POA: Diagnosis not present

## 2018-08-29 DIAGNOSIS — Y929 Unspecified place or not applicable: Secondary | ICD-10-CM | POA: Diagnosis not present

## 2018-08-29 DIAGNOSIS — S6991XA Unspecified injury of right wrist, hand and finger(s), initial encounter: Secondary | ICD-10-CM | POA: Diagnosis present

## 2018-08-29 DIAGNOSIS — W228XXA Striking against or struck by other objects, initial encounter: Secondary | ICD-10-CM | POA: Insufficient documentation

## 2018-08-29 DIAGNOSIS — F17210 Nicotine dependence, cigarettes, uncomplicated: Secondary | ICD-10-CM | POA: Insufficient documentation

## 2018-08-29 IMAGING — DX RIGHT WRIST - COMPLETE 3+ VIEW
4 series · 4 of 4 positions shown · non-contrast
Comparison: None.

CLINICAL DATA: Injury to the RIGHT wrist yesterday while trimming
his lawn. Lacerations to the volar surface of the wrist with
associated swelling. Initial encounter.

EXAM:
RIGHT WRIST - COMPLETE 3+ VIEW

[wrist ap (1 of 2)]
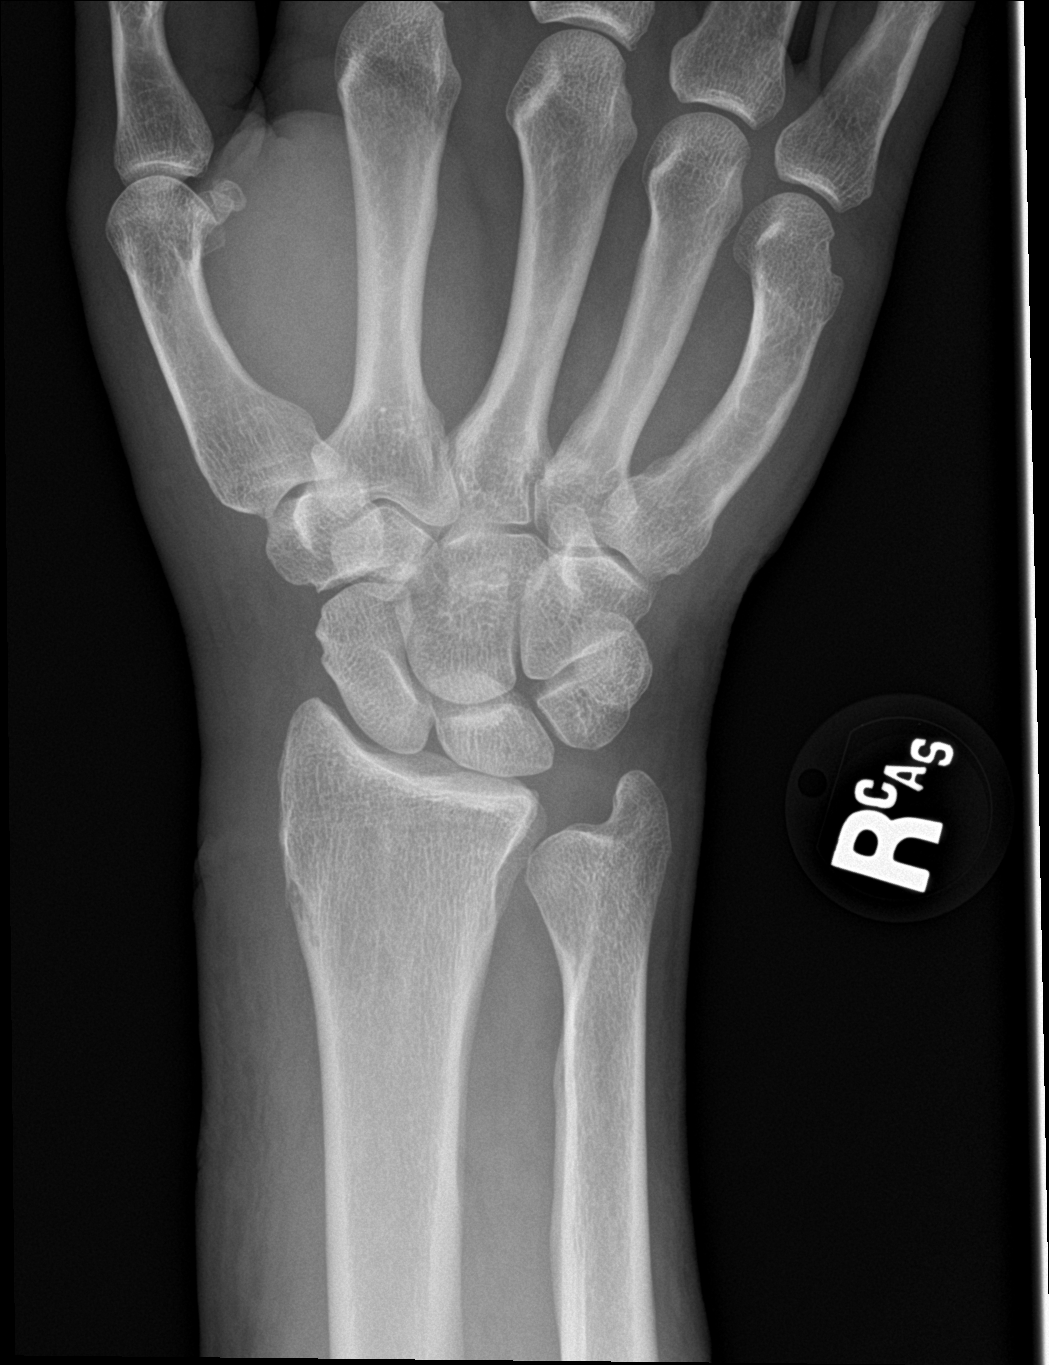

[wrist obl]
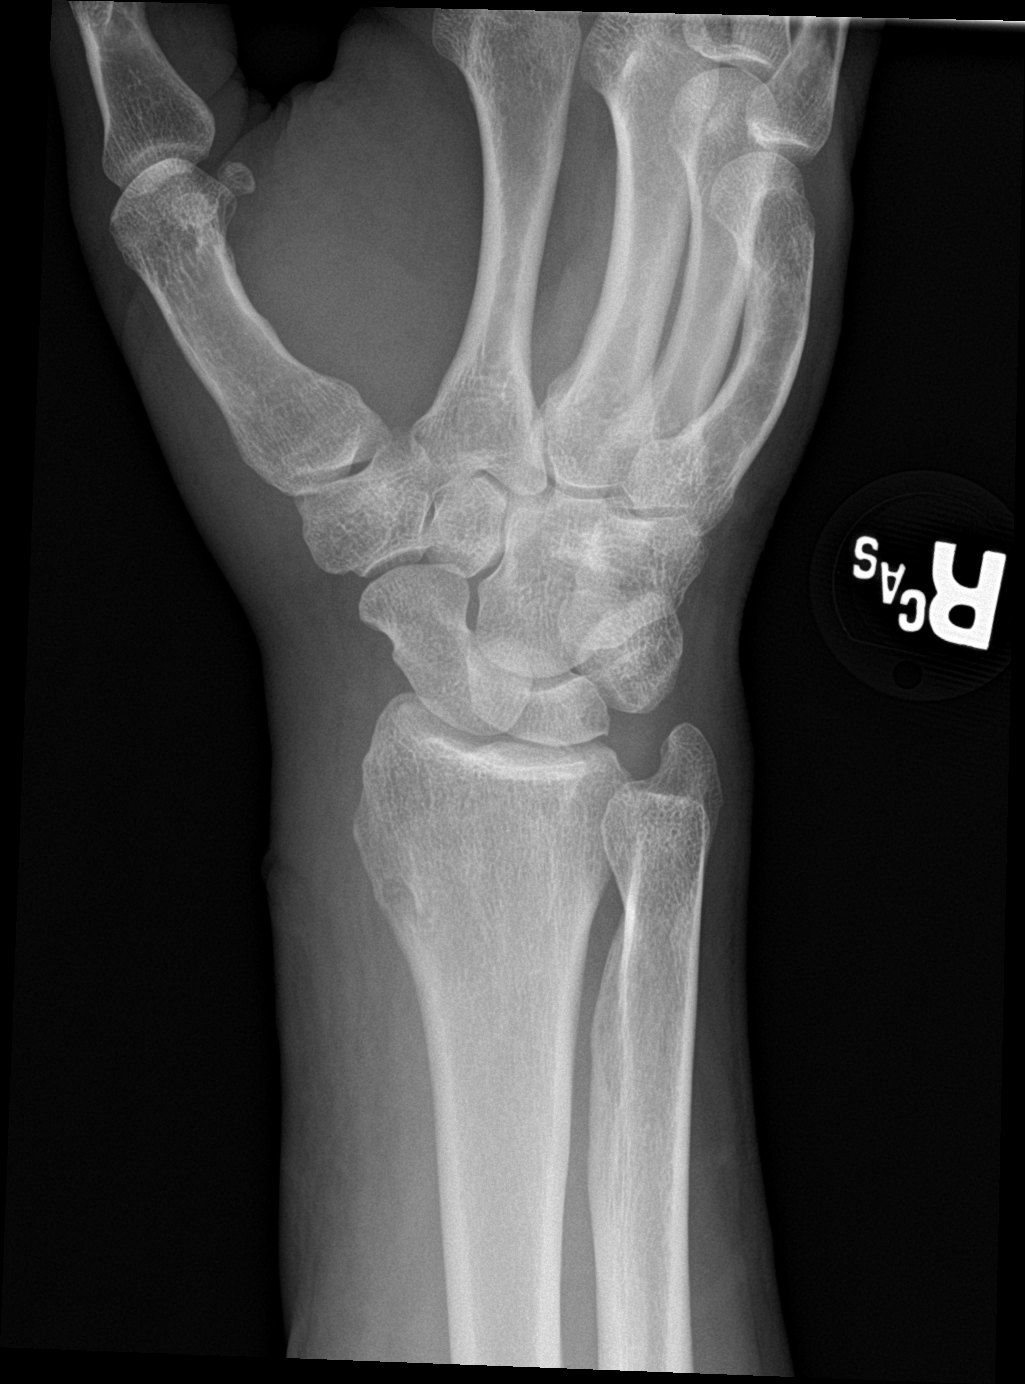

[wrist lat]
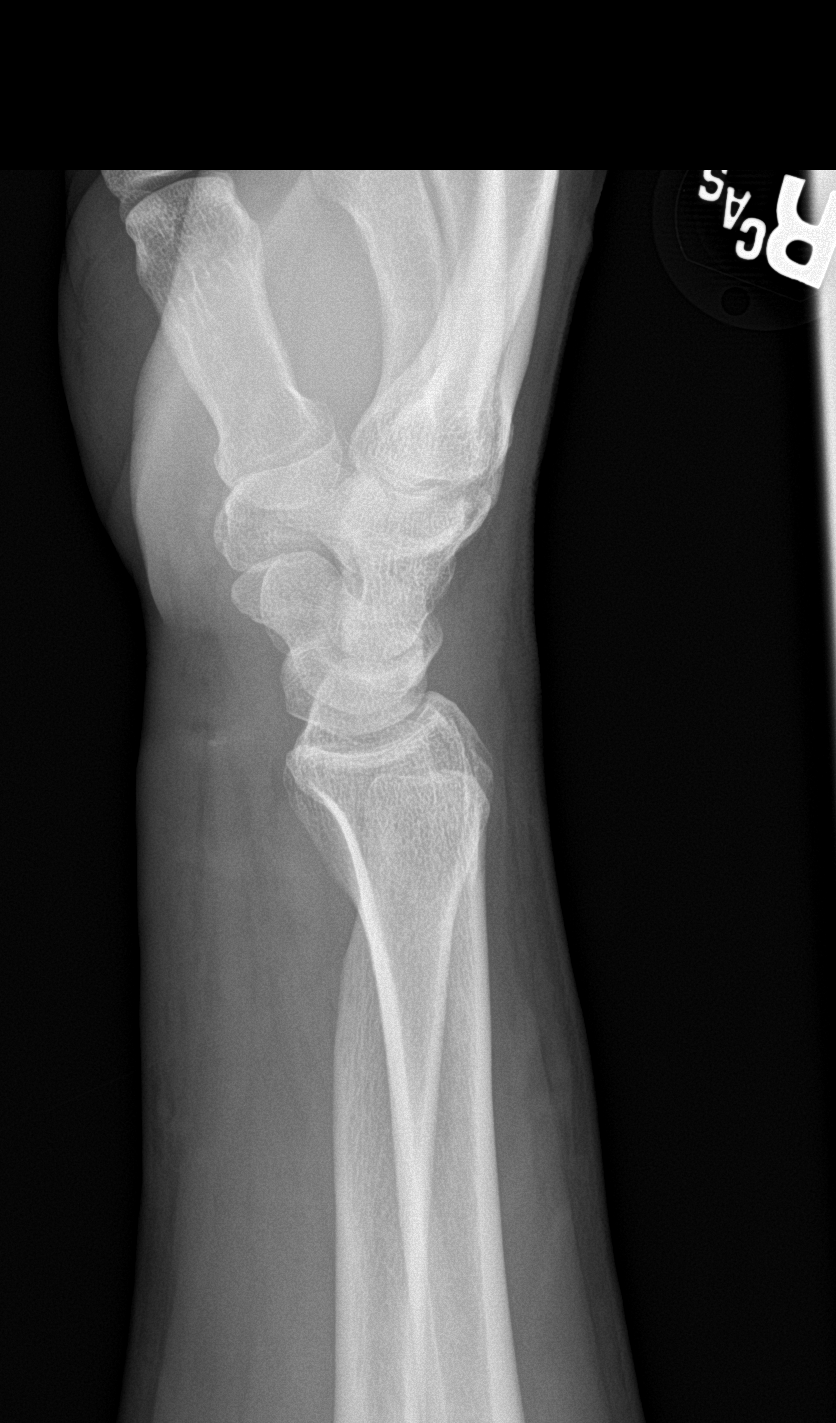

[wrist ap (2 of 2)]
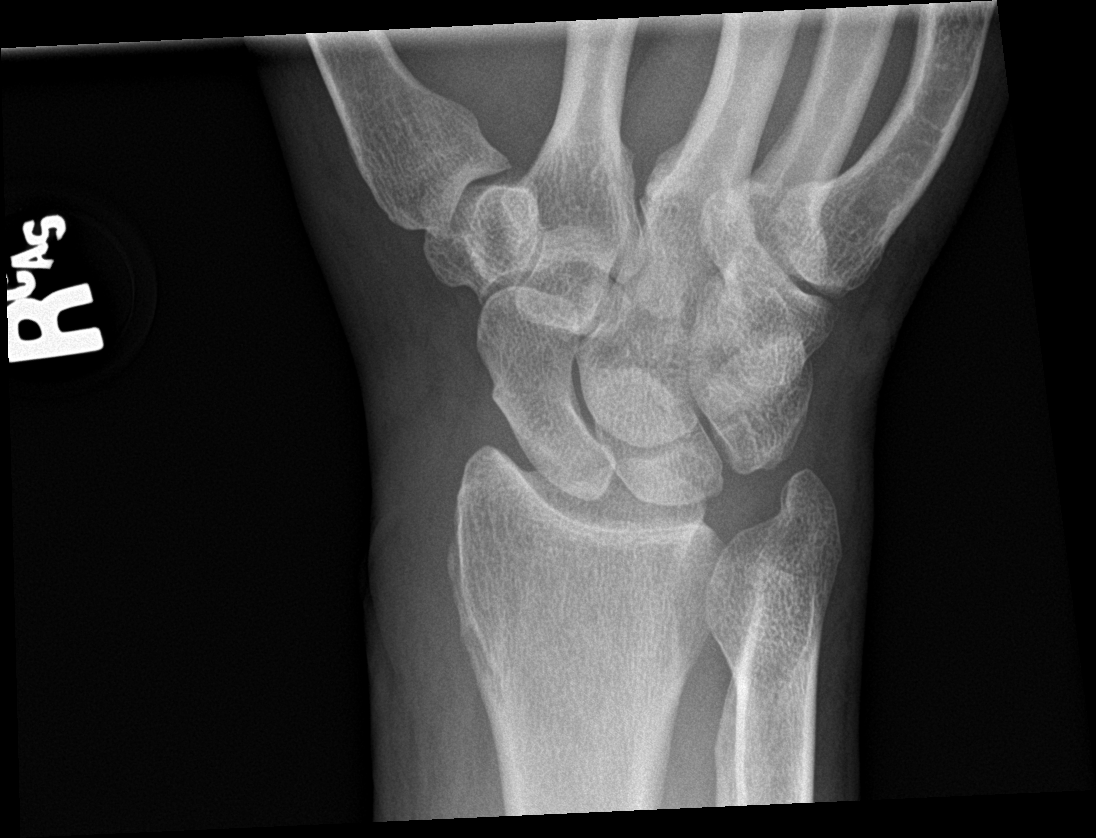

[4 of 4 positions shown; findings below may reference images not displayed]

FINDINGS: Soft tissue laceration laterally and along the volar aspect. No
opaque foreign bodies in the soft tissues. No underlying fracture.
Benign-appearing cortical lesion involving the LATERAL cortex of the
distal radial metaphysis, likely an old healed nonossifying fibroma.
No significant intrinsic osseous abnormality. Well-preserved bone
mineral density. Well preserved joint spaces.
IMPRESSION: 1. No acute or significant osseous abnormality.
2. No opaque foreign bodies in the soft tissues.

## 2018-08-29 MED ORDER — CEPHALEXIN 500 MG PO CAPS: 500.0000 mg | ORAL_CAPSULE | Freq: Three times a day (TID) | ORAL | 0 refills | Status: AC

## 2018-08-29 MED ORDER — OXYCODONE HCL 5 MG PO TABS
5.0000 mg | ORAL_TABLET | Freq: Once | ORAL | Status: AC
  Administered 2018-08-29: 16:00:00 5 mg via ORAL
  Filled 2018-08-29: qty 1

## 2018-08-29 MED ORDER — HYDROCODONE-ACETAMINOPHEN 5-325 MG PO TABS: 1.0000 | ORAL_TABLET | Freq: Four times a day (QID) | ORAL | 0 refills | Status: AC | PRN
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
## 2018-08-29 NOTE — ED Notes (Signed)
Right wrist wrapped with gauze and velcro splint applied. Pt tolerated with no issue.
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
## 2018-08-29 NOTE — Discharge Instructions (Signed)
Follow up with your primary care provider for symptoms of concern or any sign of infection. If unable to schedule an appointment, return to the ER.

## 2018-10-13 ENCOUNTER — Ambulatory Visit
Admission: EM | Admit: 2018-10-13 | Discharge: 2018-10-13 | Disposition: A | Discharge status: Home / Self Care | Payer: BC Managed Care – PPO | Attending: Emergency Medicine | Admitting: Emergency Medicine

## 2018-10-13 ENCOUNTER — Encounter: Payer: Self-pay | Admitting: Emergency Medicine

## 2018-10-13 ENCOUNTER — Other Ambulatory Visit: Payer: Self-pay

## 2018-10-13 DIAGNOSIS — S81811A Laceration without foreign body, right lower leg, initial encounter: Secondary | ICD-10-CM

## 2018-10-13 MED ORDER — MUPIROCIN 2 % EX OINT: TOPICAL_OINTMENT | CUTANEOUS | 0 refills | Status: DC

## 2018-10-13 MED ORDER — CEPHALEXIN 500 MG PO CAPS: 500.0000 mg | ORAL_CAPSULE | Freq: Three times a day (TID) | ORAL | 0 refills | Status: AC

## 2018-10-13 NOTE — Discharge Instructions (Addendum)
Take medication as prescribed. Rest. Drink plenty of fluids. Ice. Elevate. Monitor.  Keep clean with soap and water.  Follow-up with primary care or return to urgent care in 10 to 14 days for suture removal.  Reevaluation sooner for any worsening concerns.   Return to Urgent care for new or worsening concerns.

## 2018-10-13 NOTE — ED Provider Notes (Signed)
[facility] ____________________________________________  Time seen: Approximately 4:20 PM  I have reviewed the triage vital signs and the nursing notes.   HISTORY  Chief Complaint Extremity Laceration   HPI Todd Hancock is a 48 y.o. male seen for evaluation of right leg laceration that occurred just prior to arrival.  Patient reports he accidentally hit his right leg on a trailer hitch causing the injury.  States last tetanus immunization was 2 years ago.  States pain is mild.  Denies pain with walking.  Denies concern of foreign body.  Reports otherwise doing well.  Denies recent cough, congestion, sore throat or fevers.  No chest pain or shortness of breath.  Denies aggravating or alleviating factors.  Lynnell Jude, MD: PCP   Past Medical History:  Diagnosis Date  . Medical history non-contributory     There are no active problems to display for this patient.   Past Surgical History:  Procedure Laterality Date  . KNEE ARTHROSCOPY Right   . TENNIS ELBOW RELEASE/NIRSCHEL PROCEDURE Right 03/31/2016   Procedure: TENNIS ELBOW RELEASE/NIRSCHEL PROCEDURE;  Surgeon: Earnestine Leys, MD;  Location: [facility] ORS;  Service: Orthopedics;  Laterality: Right;     No current facility-administered medications for this encounter.   Current Outpatient Medications:  .  cephALEXin (KEFLEX) 500 MG capsule, Take 1 capsule (500 mg total) by mouth 3 (three) times daily for 7 days., Disp: 21 capsule, Rfl: 0 .  mupirocin ointment (BACTROBAN) 2 %, Apply two times a day for 7 days., Disp: 22 g, Rfl: 0  Allergies Patient has no known allergies.  Family History  Problem Relation Age of Onset  . Cancer Father     Social History Social History   Tobacco Use  . Smoking status: Current Some Day Smoker    Years: 5.00    Types: Cigarettes  . Smokeless tobacco: Never Used  . Tobacco comment: PT SMOKES 1 PACK/WEEKLY-ONLY SMOKES WHEN HE DRINKS  Substance Use Topics  . Alcohol use:  Yes    Comment: OCC  . Drug use: No    Review of Systems Constitutional: No fever Cardiovascular: Denies chest pain. Respiratory: Denies shortness of breath. Gastrointestinal: No abdominal pain.  Musculoskeletal: Negative for back pain. Skin: Positive for laceration   ____________________________________________   PHYSICAL EXAM:  VITAL SIGNS: ED Triage Vitals  Enc Vitals Group     BP 10/13/18 1536 (!) 154/109     Pulse Rate 10/13/18 1536 96     Resp 10/13/18 1536 18     Temp 10/13/18 1536 98.6 F (37 C)     Temp Source 10/13/18 1536 Oral     SpO2 10/13/18 1536 98 %     Weight 10/13/18 1533 185 lb (83.9 kg)     Height 10/13/18 1533 6' (1.829 m)     Head Circumference --      Peak Flow --      Pain Score 10/13/18 1533 2     Pain Loc --      Pain Edu? --      Excl. in [place]? --     Constitutional: Alert and oriented. Well appearing and in no acute distress. ENT      Head: Normocephalic and atraumatic. Cardiovascular: Normal rate, regular rhythm. Grossly normal heart sounds.  Good peripheral circulation. Respiratory: Normal respiratory effort without tachypnea nor retractions. Breath sounds are clear and equal bilaterally. No wheezes, rales, rhonchi. Musculoskeletal:   Neurologic:  Normal speech and language. Speech is normal. No gait instability.  Skin:  Skin is warm, dry.  Except: Right medial mid shin linear laceration 5.5 cm, minimal active bleeding, minimal tenderness, no point bony tenderness, no tendon laceration noted, no foreign body visible, able to fully plantarflex and dorsiflex without pain, normal distal sensation and pulses to right foot. Psychiatric: Mood and affect are normal. Speech and behavior are normal. Patient exhibits appropriate insight and judgment   ___________________________________________   LABS (all labs ordered are listed, but only abnormal results are displayed)  Labs Reviewed - No data to display  RADIOLOGY  No results found.  ____________________________________________   PROCEDURES Procedures   Procedure(s) performed:  Procedure explained and verbal consent obtained. Consent: Verbal consent obtained. Written consent not obtained. Risks and benefits: risks, benefits and alternatives were discussed Patient identity confirmed: verbally with patient and hospital-assigned identification number  Consent given by: patient   Laceration Repair Location: Right leg Length: 5.5 cm Foreign bodies: no foreign bodies Tendon involvement: none Nerve involvement: none Preparation: Patient was prepped and draped in the usual sterile fashion. Anesthesia with 1% Lidocaine with epi 6 Mls Cleaned with Betadine Irrigation solution: saline Irrigation method: jet lavage Amount of cleaning: copious Wound margins revised. Repaired with 5-0 nylon Number of sutures: 13 Technique: simple interrupted  Approximation: loose Patient tolerate well. Wound well approximated post repair.  Antibiotic ointment and dressing applied.  Wound care instructions provided.  Observe for any signs of infection or other problems.      INITIAL IMPRESSION / ASSESSMENT AND PLAN / ED COURSE  Pertinent labs & imaging results that were available during my care of the patient were reviewed by me and considered in my medical decision making (see chart for details).  Well-appearing patient.  No acute distress.  Right leg laceration.  Patient reports tetanus immunization is up-to-date.  Wound copiously cleaned, irrigated and repaired as above.  Patient tolerated well.  Will treat with oral Keflex and Bactroban.  Over-the-counter Tylenol ibuprofen as needed.  Ice, elevate, rest and avoidance of strenuous activity.  Suture removal in 10 to 14 days.  Discussed wound care and cleaning.  Discussed follow up with Primary care physician this week. Discussed follow up and return parameters including no resolution or any worsening concerns. Patient verbalized  understanding and agreed to plan.   ____________________________________________   FINAL CLINICAL IMPRESSION(S) / ED DIAGNOSES  Final diagnoses:  Leg laceration, right, initial encounter     ED Discharge Orders         Ordered    cephALEXin (KEFLEX) 500 MG capsule  3 times daily     10/13/18 1622    mupirocin ointment (BACTROBAN) 2 %     10/13/18 1622           Note: This dictation was prepared with Dragon dictation along with smaller phrase technology. Any transcriptional errors that result from this process are unintentional.         Renford DillsMiller, Zayvier Caravello, NP 10/13/18 1637

## 2018-10-13 NOTE — ED Triage Notes (Signed)
Pt has laceration on his lower right leg. He states that he walked into a [employer]. Occurred about 30 minutes ago. He states that he had a tetanus about 2 years ago.

## 2020-02-16 ENCOUNTER — Ambulatory Visit
Admission: EM | Admit: 2020-02-16 | Discharge: 2020-02-16 | Disposition: A | Discharge status: Home / Self Care | Payer: BC Managed Care – PPO | Attending: Physician Assistant | Admitting: Physician Assistant

## 2020-02-16 ENCOUNTER — Other Ambulatory Visit: Payer: Self-pay

## 2020-02-16 ENCOUNTER — Encounter: Payer: Self-pay | Admitting: Emergency Medicine

## 2020-02-16 DIAGNOSIS — S46002A Unspecified injury of muscle(s) and tendon(s) of the rotator cuff of left shoulder, initial encounter: Secondary | ICD-10-CM

## 2020-02-16 DIAGNOSIS — S46912A Strain of unspecified muscle, fascia and tendon at shoulder and upper arm level, left arm, initial encounter: Secondary | ICD-10-CM

## 2020-02-16 MED ORDER — CYCLOBENZAPRINE HCL 10 MG PO TABS: 10.0000 mg | ORAL_TABLET | Freq: Every day | ORAL | 0 refills | Status: AC

## 2020-02-16 MED ORDER — DICLOFENAC SODIUM 75 MG PO TBEC: 75.0000 mg | DELAYED_RELEASE_TABLET | Freq: Two times a day (BID) | ORAL | 0 refills | Status: AC

## 2020-02-16 NOTE — Discharge Instructions (Signed)
You have a condition requiring you to follow up with Orthopedics so please call one of the following office for appointment:   [facility] [address], [place], [place] [address] Phone: [phone]

## 2020-02-16 NOTE — ED Provider Notes (Signed)
[facility]    CSN: [id number] Arrival date & time: 02/16/20  1257      History   Chief Complaint Chief Complaint  Patient presents with   Shoulder Pain    left    HPI Todd Hancock is a 49 y.o. male presenting for chronic left shoulder pain that has worsened over the past 4 days following an injury at home.  He states that he was moving things and pushing a wheel barrow when he felt a sudden and sharp pain in his shoulder.  He says it felt like something popped.  Denies trauma to the shoulder.  He admits to intense pain.  Pain is worse when he raises his shoulder or tries to reach behind his back.  He states that he has had chronic left shoulder pain due to old football injury when he was a teenager and has known that he needed to get some sort of surgery on the shoulder, but has been putting that off for many years.  He has not taken anything for pain.  Admits to difficulty sleeping at night due to pain.  He denies any radiation of pain down the arm.  Denies any numbness, weakness or tingling.  Patient states that he did miss work for couple days and would like a work note.  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  He says he has not been able to get into see his primary care provider and will not be able to do so for another 10 days.  He does not have any other complaints or concerns today.  HPI  Past Medical History:  Diagnosis Date   Medical history non-contributory     There are no problems to display for this patient.   Past Surgical History:  Procedure Laterality Date   KNEE ARTHROSCOPY Right    TENNIS ELBOW RELEASE/NIRSCHEL PROCEDURE Right 03/31/2016   Procedure: TENNIS ELBOW RELEASE/NIRSCHEL PROCEDURE;  Surgeon: Deeann Saint, MD;  Location: [facility] ORS;  Service: Orthopedics;  Laterality: Right;       Home Medications    Prior to Admission medications   Medication Sig Start Date  End Date Taking? Authorizing Provider  cyclobenzaprine (FLEXERIL) 10 MG tablet Take 1 tablet (10 mg total) by mouth at bedtime for 15 days. 02/16/20 03/02/20  Shirlee Latch, PA-C  diclofenac (VOLTAREN) 75 MG EC tablet Take 1 tablet (75 mg total) by mouth 2 (two) times daily. 02/16/20 03/17/20  Eusebio Friendly B, PA-C  mupirocin ointment (BACTROBAN) 2 % Apply two times a day for 7 days. 10/13/18   Renford Dills, NP  gabapentin (NEURONTIN) 400 MG capsule Take 1 capsule (400 mg total) by mouth 2 (two) times daily. 03/31/16 10/13/18  Deeann Saint, MD    Family History Family History  Problem Relation Age of Onset   Cancer Father     Social History Social History   Tobacco Use   Smoking status: Current Some Day Smoker    Years: 5.00    Types: Cigarettes   Smokeless tobacco: Never Used   Tobacco comment: PT SMOKES 1 PACK/WEEKLY-ONLY SMOKES WHEN HE DRINKS  Vaping Use   Vaping Use: Never used  Substance Use Topics   Alcohol use: Yes    Comment: OCC   Drug use: No     Allergies   Patient has no known allergies.   Review of Systems Review of Systems  Musculoskeletal: Positive  for arthralgias and back pain (chronic). Negative for joint swelling and myalgias.  Skin: Negative for color change, rash and wound.  Neurological: Negative for weakness and numbness.     Physical Exam Triage Vital Signs ED Triage Vitals  Enc Vitals Group     BP 02/16/20 1318 (!) 159/97     Pulse Rate 02/16/20 1318 91     Resp 02/16/20 1318 18     Temp 02/16/20 1318 98.1 F (36.7 C)     Temp Source 02/16/20 1318 Oral     SpO2 02/16/20 1318 98 %     Weight 02/16/20 1319 184 lb 15.5 oz (83.9 kg)     Height 02/16/20 1319 6' (1.829 m)     Head Circumference --      Peak Flow --      Pain Score 02/16/20 1319 5     Pain Loc --      Pain Edu? --      Excl. in [place]? --    No data found.  Updated Vital Signs BP (!) 159/97 (BP Location: Right Arm)    Pulse 91    Temp 98.1 F (36.7 C) (Oral)     Resp 18    Ht 6' (1.829 m)    Wt 184 lb 15.5 oz (83.9 kg)    SpO2 98%    BMI 25.09 kg/m       Physical Exam Vitals and nursing note reviewed.  Constitutional:      General: He is not in acute distress.    Appearance: Normal appearance. He is well-developed. He is not toxic-appearing.  HENT:     Head: Normocephalic and atraumatic.  Eyes:     General: No scleral icterus.    Conjunctiva/sclera: Conjunctivae normal.  Cardiovascular:     Rate and Rhythm: Normal rate and regular rhythm.  Pulmonary:     Effort: Pulmonary effort is normal. No respiratory distress.  Musculoskeletal:     Right shoulder: Normal strength.     Left shoulder: Tenderness (anterior superior shoulder diffusely) present. No swelling, deformity or bony tenderness. Decreased range of motion (reduced flexion and abduction beyond 90 degrees). Normal strength.     Cervical back: Neck supple.  Skin:    General: Skin is warm and dry.  Neurological:     General: No focal deficit present.     Mental Status: He is alert. Mental status is at baseline.     Motor: No weakness.     Gait: Gait normal.  Psychiatric:        Mood and Affect: Mood normal.        Behavior: Behavior normal.        Thought Content: Thought content normal.      UC Treatments / Results  Labs (all labs ordered are listed, but only abnormal results are displayed) Labs Reviewed - No data to display  EKG   Radiology No results found.  Procedures Procedures (including critical care time)  Medications Ordered in UC Medications - No data to display  Initial Impression / Assessment and Plan / UC Course  I have reviewed the triage vital signs and the nursing notes.  Pertinent labs & imaging results that were available during my care of the patient were reviewed by me and considered in my medical decision making (see chart for details).   Patient's exam and mechanism of injury suspicious for possible rotator cuff tear.  Advised that he will  likely need to follow-up with orthopedics and provided  him with the contact information for [place] [facility].  At this time, treating with diclofenac and Tylenol for pain relief.  Advised Flexeril at bedtime to help relax muscles and assist with sleep.  Advised not to do any overhead lifting or reaching as that can exacerbate the injury.  I did write a work note for couple days for the patient and advised him that if he needs any more work months he will need follow-up with orthopedics.  Follow-up with our clinic as needed for any new or worsening symptoms.  Final Clinical Impressions(s) / UC Diagnoses   Final diagnoses:  Injury of left rotator cuff, initial encounter  Strain of left shoulder, initial encounter     Discharge Instructions     You have a condition requiring you to follow up with Orthopedics so please call one of the following office for appointment:   [facility] [address] [place], [place] [address] Phone: [phone]     ED Prescriptions    Medication Sig Dispense Auth. Provider   diclofenac (VOLTAREN) 75 MG EC tablet Take 1 tablet (75 mg total) by mouth 2 (two) times daily. 60 tablet Eusebio Friendly B, PA-C   cyclobenzaprine (FLEXERIL) 10 MG tablet Take 1 tablet (10 mg total) by mouth at bedtime for 15 days. 15 tablet Shirlee Latch, PA-C     I have reviewed the PDMP during this encounter.   Shirlee Latch, PA-C 02/16/20 1401

## 2020-02-16 NOTE — ED Triage Notes (Signed)
Pt c/o left shoulder pain. He states he has a a rotator cuff tear but has not been diagnoses with it. 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 He states he has been out of work for the last 2 days and wants to be out of work. Pt was advised we do not write notes for more than 2 days.

## 2020-02-22 ENCOUNTER — Other Ambulatory Visit: Payer: Self-pay | Admitting: Orthopedic Surgery

## 2020-02-22 ENCOUNTER — Other Ambulatory Visit (HOSPITAL_COMMUNITY): Payer: Self-pay | Admitting: Orthopedic Surgery

## 2020-02-22 DIAGNOSIS — M25312 Other instability, left shoulder: Secondary | ICD-10-CM

## 2020-02-22 DIAGNOSIS — M25512 Pain in left shoulder: Secondary | ICD-10-CM

## 2020-02-22 DIAGNOSIS — S46002A Unspecified injury of muscle(s) and tendon(s) of the rotator cuff of left shoulder, initial encounter: Secondary | ICD-10-CM

## 2020-03-05 ENCOUNTER — Ambulatory Visit
Admission: RE | Admit: 2020-03-05 | Discharge: 2020-03-05 | Disposition: A | Discharge status: Home / Self Care | Payer: BC Managed Care – PPO | Source: Ambulatory Visit | Attending: Orthopedic Surgery | Admitting: Orthopedic Surgery

## 2020-03-05 ENCOUNTER — Other Ambulatory Visit: Payer: Self-pay

## 2020-03-05 DIAGNOSIS — M25312 Other instability, left shoulder: Secondary | ICD-10-CM | POA: Insufficient documentation

## 2020-03-05 DIAGNOSIS — S46002A Unspecified injury of muscle(s) and tendon(s) of the rotator cuff of left shoulder, initial encounter: Secondary | ICD-10-CM | POA: Insufficient documentation

## 2020-03-05 DIAGNOSIS — M25512 Pain in left shoulder: Secondary | ICD-10-CM | POA: Insufficient documentation

## 2020-03-05 IMAGING — MR MR SHOULDER*L* W/O CM
5 series · 36 of 40 positions shown · non-contrast
Comparison: None.

CLINICAL DATA: Limited range of motion, pain

EXAM:
MRI OF THE LEFT SHOULDER WITHOUT CONTRAST
TECHNIQUE: Multiplanar, multisequence MR imaging of the shoulder was performed.
No intravenous contrast was administered.

[Series 3: T2 fat-sat · axial · left · 4.0mm · 0.44mm/px · z∈[-35,+85]mm · 8 of 26 slices shown (1 of 3)]
[im 1/26]
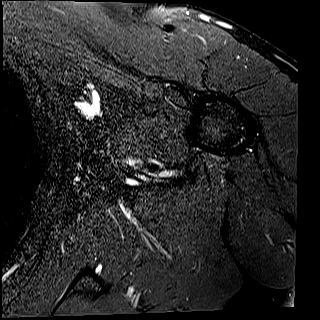
[im 4/26]
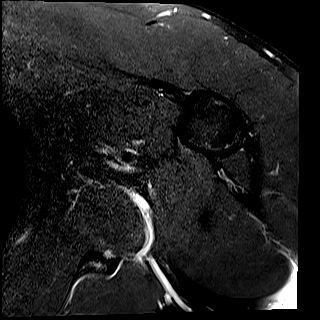
[im 8/26]
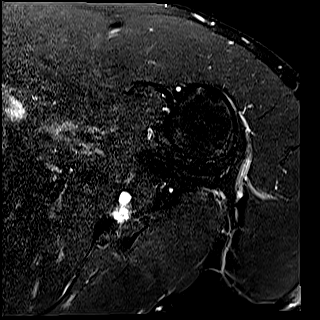
[im 11/26]
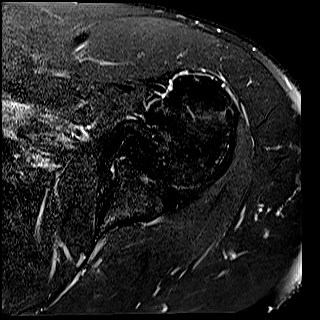
[im 15/26]
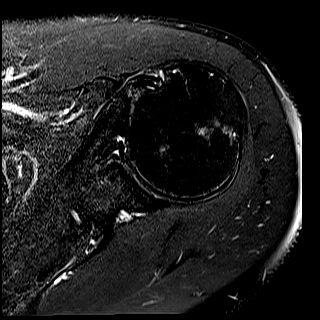
[im 18/26]
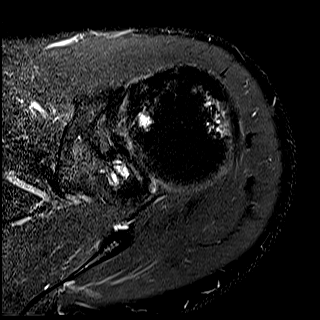
[im 22/26]
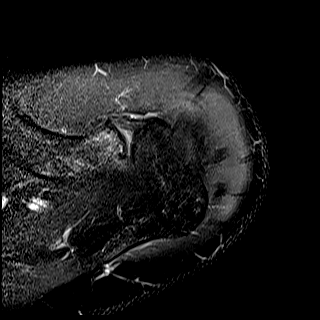
[im 26/26]
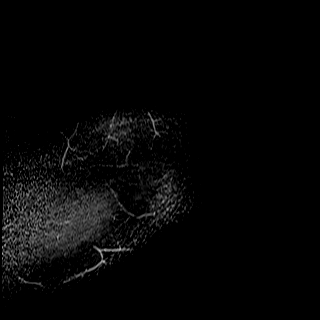

[Series 4: PD · oblique · left · 4.0mm · 0.44mm/px · 9 of 26 slices shown]
[im 1/26]
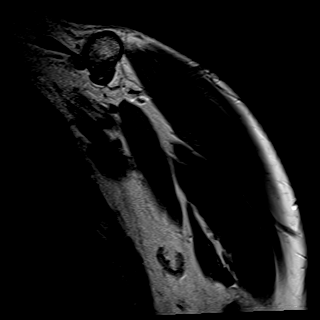
[im 4/26]
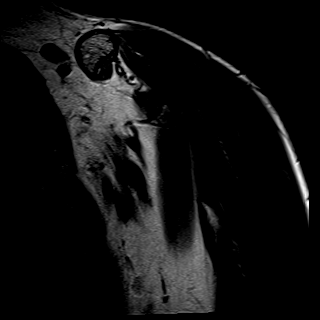
[im 7/26]
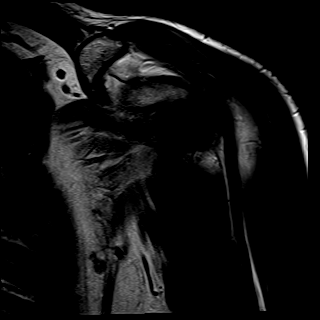
[im 10/26]
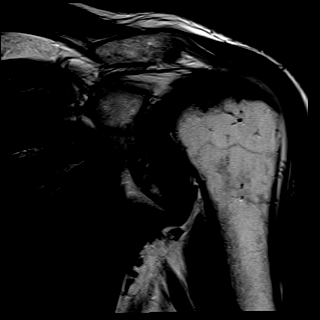
[im 13/26]
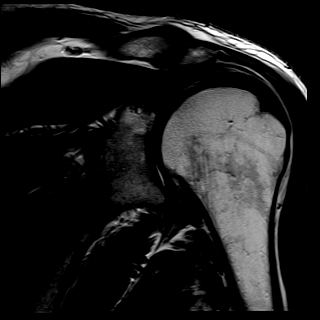
[im 16/26]
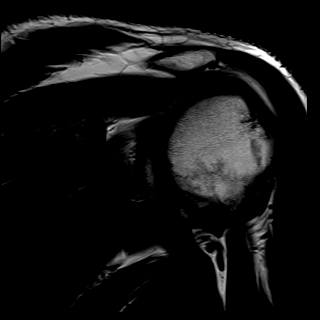
[im 19/26]
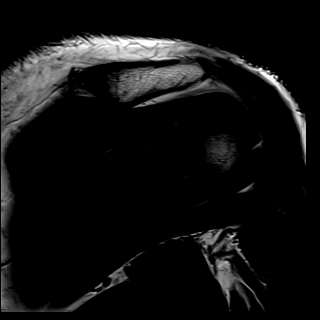
[im 22/26]
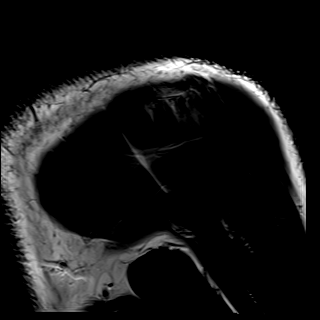
[im 26/26]
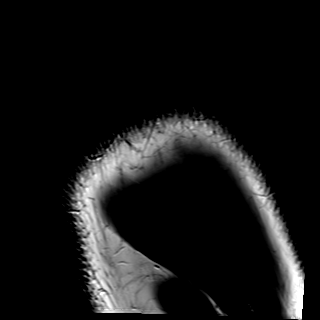

[Series 5: T2 fat-sat · oblique · left · 4.0mm · 0.44mm/px · 9 of 26 slices shown (2 of 3)]
[im 1/26]
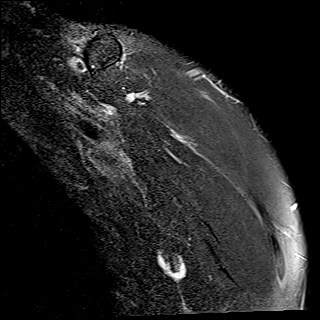
[im 4/26]
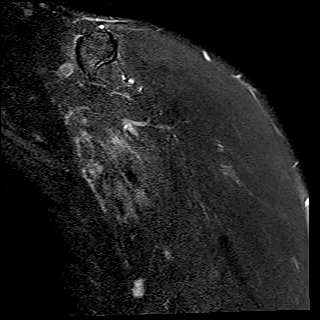
[im 7/26]
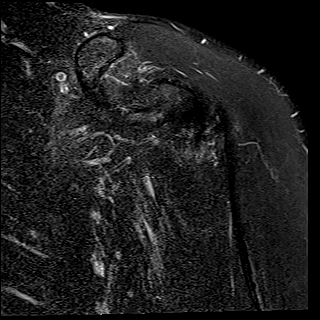
[im 10/26]
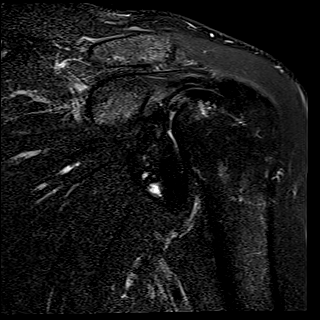
[im 13/26]
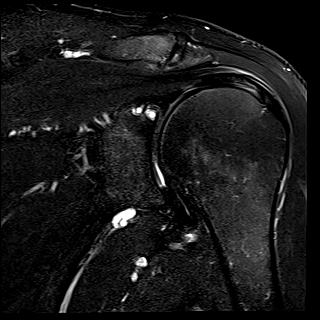
[im 16/26]
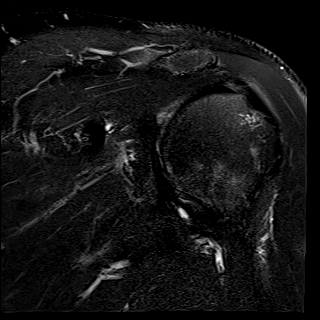
[im 19/26]
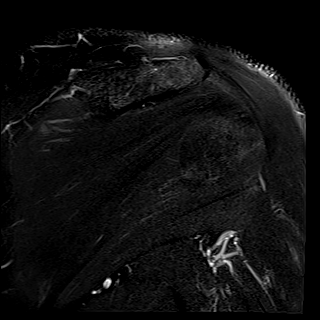
[im 22/26]
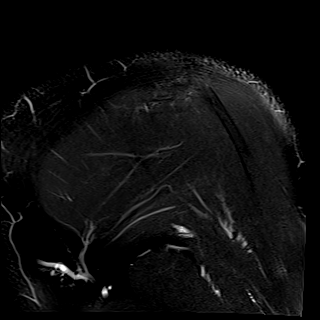
[im 26/26]
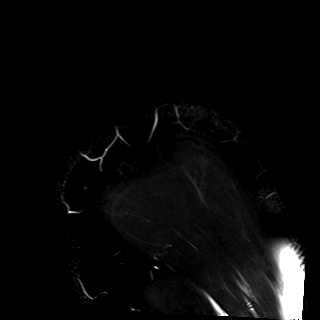

[Series 6: T2 fat-sat · oblique · left · 4.0mm · 0.27mm/px · 7 of 22 slices shown (3 of 3)]
[im 1/22]
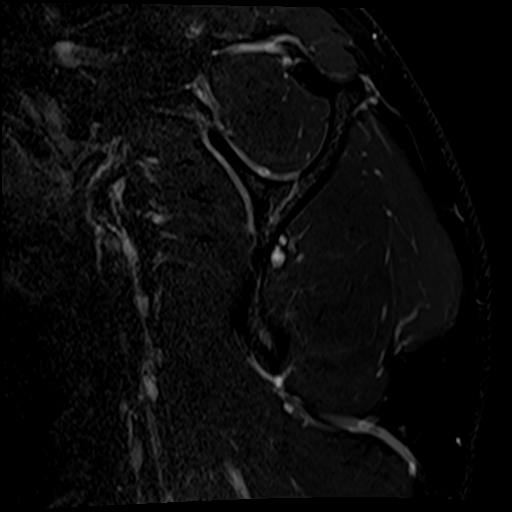
[im 4/22]
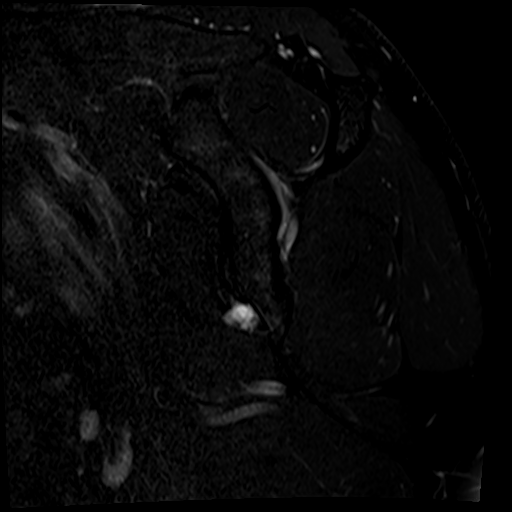
[im 8/22]
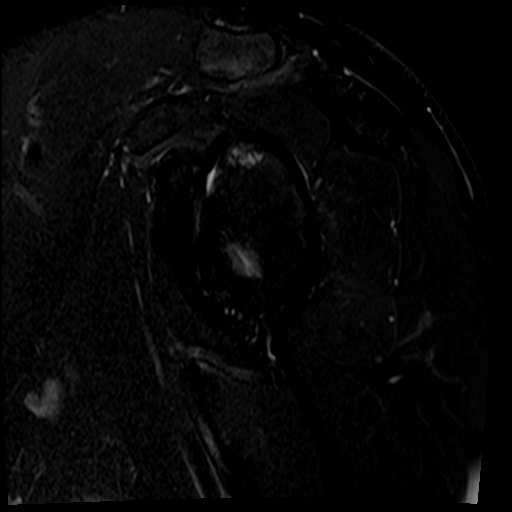
[im 11/22]
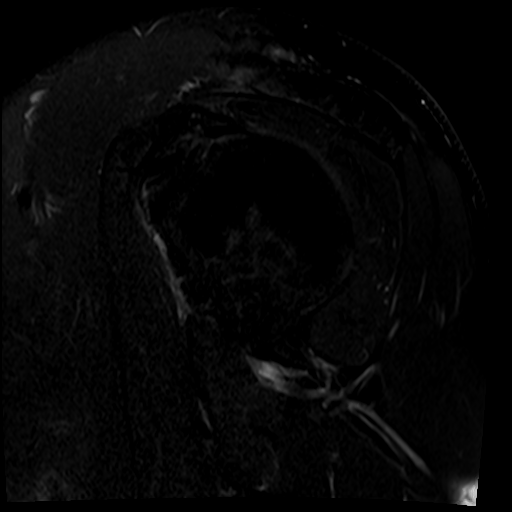
[im 15/22]
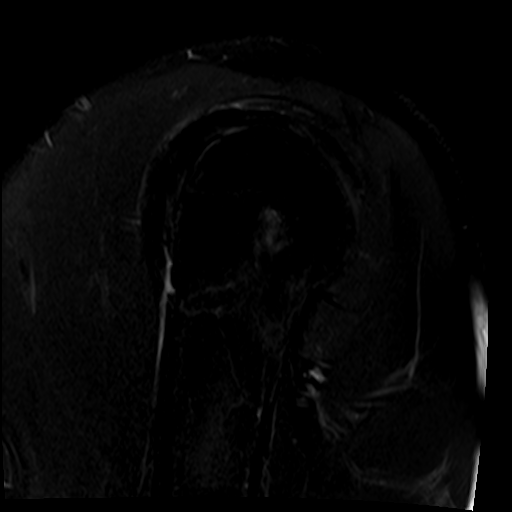
[im 18/22]
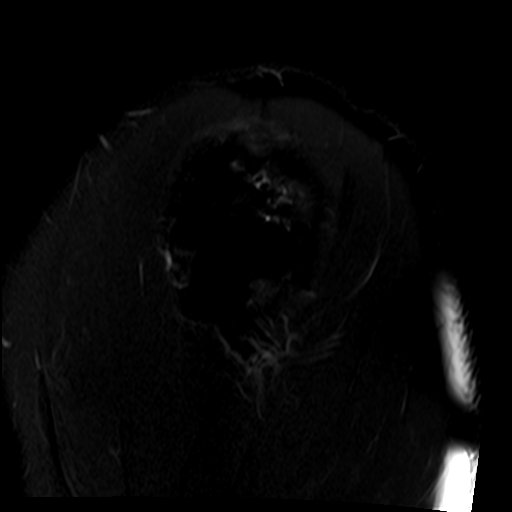
[im 22/22]
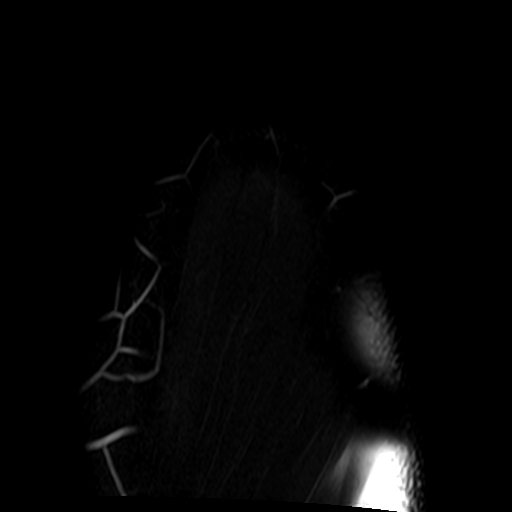

[Series 7: T1 · oblique · left · 4.0mm · 0.44mm/px · 3 of 22 slices shown]
[im 1/22]
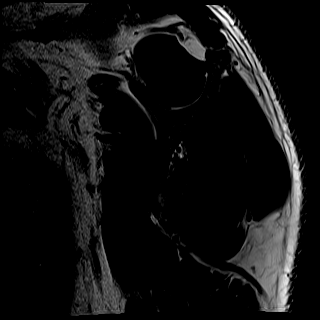
[im 4/22]
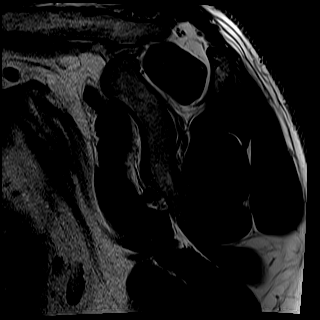
[im 8/22]
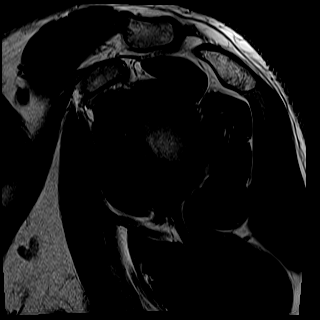

[36 of 40 positions shown; findings below may reference images not displayed]

FINDINGS: Rotator cuff: Mild tendinosis of the supraspinatus tendon with a
small partial-thickness articular surface tear along the posterior
aspect. Infraspinatus tendon is intact. Teres minor tendon is
intact. Subscapularis tendon is intact.

Muscles: No muscle atrophy or edema. No intramuscular fluid
collection or hematoma.

Biceps Long Head: Intraarticular and extraarticular portions of the
biceps tendon are intact.

Acromioclavicular Joint: Mild arthropathy of the acromioclavicular
joint. Type II acromion. No subacromial/subdeltoid bursal fluid.

Glenohumeral Joint: No joint effusion. Partial-thickness cartilage
loss of the glenohumeral joint. Subchondral reactive marrow changes
along the superior glenoid.

Labrum: Grossly intact, but evaluation is limited by lack of
intraarticular fluid/contrast.

Bones: No fracture or dislocation. No aggressive osseous lesion.
Subcortical reactive marrow changes in the lesser tuberosity with
narrowing of the coracohumeral distance as can be seen with
subcoracoid impingement.

Other: No fluid collection or hematoma.
IMPRESSION: 1. Mild tendinosis of the supraspinatus tendon with a small
partial-thickness articular surface tear along the posterior aspect.
2. Mild osteoarthritis of the glenohumeral joint.

## 2021-06-19 ENCOUNTER — Ambulatory Visit
Admission: EM | Admit: 2021-06-19 | Discharge: 2021-06-19 | Disposition: A | Discharge status: Home / Self Care | Payer: BC Managed Care – PPO | Attending: Emergency Medicine | Admitting: Emergency Medicine

## 2021-06-19 ENCOUNTER — Other Ambulatory Visit: Payer: Self-pay

## 2021-06-19 DIAGNOSIS — S0501XD Injury of conjunctiva and corneal abrasion without foreign body, right eye, subsequent encounter: Secondary | ICD-10-CM

## 2021-06-19 MED ORDER — POLYMYXIN B-TRIMETHOPRIM 10000-0.1 UNIT/ML-% OP SOLN: 2.0000 [drp] | OPHTHALMIC | 1 refills | Status: DC

## 2021-06-19 NOTE — ED Triage Notes (Signed)
Pt here with C/O right eye injury, was cutting wood on Sunday and walked into a branch, woke up yesterday with drainage, woke up this morning with pain.  ?

## 2021-06-19 NOTE — ED Provider Notes (Addendum)
?[facility] ? ? ? ?CSN: [id number] ?Arrival date & time: 06/19/21  0915 ? ? ?  ? ?History   ?Chief Complaint ?Chief Complaint  ?Patient presents with  ? Eye Problem  ? ? ?HPI ?Todd Hancock is a 51 y.o. male.  ? ?Patient presents today with right pain after a tree branch hit him in the eye on Sunday.  He awoke yesterday and began having severe pain not able to rest.  Patient states slight blurred vision but he is still able to see out of the eye.  Patient has not taken anything prior to arrival.  He has had a corneal abrasion to the same eye previously years ago and feels it is the same. ? ? ?Past Medical History:  ?Diagnosis Date  ? Medical history non-contributory   ? ? ?There are no problems to display for this patient. ? ? ?Past Surgical History:  ?Procedure Laterality Date  ? KNEE ARTHROSCOPY Right   ? TENNIS ELBOW RELEASE/NIRSCHEL PROCEDURE Right 03/31/2016  ? Procedure: TENNIS ELBOW RELEASE/NIRSCHEL PROCEDURE;  Surgeon: Deeann Saint, MD;  Location: [facility] ORS;  Service: Orthopedics;  Laterality: Right;  ? ? ? ? ? ?Home Medications   ? ?Prior to Admission medications   ?Medication Sig Start Date End Date Taking? Authorizing Provider  ?trimethoprim-polymyxin b (POLYTRIM) ophthalmic solution Place 2 drops into the right eye every 4 (four) hours. 06/19/21  Yes Coralyn Mark, NP  ?mupirocin ointment (BACTROBAN) 2 % Apply two times a day for 7 days. 10/13/18   Renford Dills, NP  ?gabapentin (NEURONTIN) 400 MG capsule Take 1 capsule (400 mg total) by mouth 2 (two) times daily. 03/31/16 10/13/18  Deeann Saint, MD  ? ? ?Family History ?Family History  ?Problem Relation Age of Onset  ? Cancer Father   ? ? ?Social History ?Social History  ? ?Tobacco Use  ? Smoking status: Some Days  ?  Years: 5.00  ?  Types: Cigarettes  ? Smokeless tobacco: Never  ? Tobacco comments:  ?  PT SMOKES 1 PACK/WEEKLY-ONLY SMOKES WHEN HE DRINKS  ?Vaping Use  ? Vaping Use: Never used  ?Substance Use Topics  ? Alcohol use: Yes  ?   Comment: OCC  ? Drug use: No  ? ? ? ?Allergies   ?Patient has no known allergies. ? ? ?Review of Systems ?Review of Systems  ?Constitutional:  Negative for fever.  ?HENT: Negative.    ?Eyes:  Positive for photophobia, pain, discharge, redness and visual disturbance.  ?Respiratory: Negative.    ?Cardiovascular: Negative.   ?Gastrointestinal: Negative.   ?Neurological: Negative.   ? ? ?Physical Exam ?Triage Vital Signs ?ED Triage Vitals  ?Enc Vitals Group  ?   BP 06/19/21 0929 126/87  ?   Pulse Rate 06/19/21 0929 100  ?   Resp 06/19/21 0929 18  ?   Temp 06/19/21 0929 98.8 ?F (37.1 ?C)  ?   Temp Source 06/19/21 0929 Oral  ?   SpO2 06/19/21 0929 96 %  ?   Weight 06/19/21 0928 185 lb (83.9 kg)  ?   Height 06/19/21 0928 6' (1.829 m)  ?   Head Circumference --   ?   Peak Flow --   ?   Pain Score 06/19/21 0925 3  ?   Pain Loc --   ?   Pain Edu? --   ?   Excl. in [place]? --   ? ?No data found. ? ?Updated Vital Signs ?BP 126/87 (BP Location: Right Arm)  Pulse 100   Temp 98.8 ?F (37.1 ?C) (Oral)   Resp 18   Ht 6' (1.829 m)   Wt 185 lb (83.9 kg)   SpO2 96%   BMI 25.09 kg/m?  ? ?Visual Acuity ?Right Eye Distance: 20/50 corrected ?Left Eye Distance: 20/20 corrected ?Bilateral Distance: 20/20 corrected ? ?Right Eye Near:   ?Left Eye Near:    ?Bilateral Near:    ? ?Physical Exam ?Constitutional:   ?   General: He is in acute distress.  ?HENT:  ?   Nose: Nose normal.  ?   Mouth/Throat:  ?   Mouth: Mucous membranes are moist.  ?Eyes:  ?   General:     ?   Right eye: Discharge present.  ?   Extraocular Movements: Extraocular movements intact.  ?   Conjunctiva/sclera:  ?   Right eye: Exudate present.  ? ?   Comments: Tetracaine and Woods lamp used visualized corneal abrasion.  ?Neurological:  ?   Mental Status: He is alert.  ? ? ? ?UC Treatments / Results  ?Labs ?(all labs ordered are listed, but only abnormal results are displayed) ?Labs Reviewed - No data to display ? ?EKG ? ? ?Radiology ?No results  found. ? ?Procedures ?Procedures (including critical care time) ? ?Medications Ordered in UC ?Medications - No data to display ? ?Initial Impression / Assessment and Plan / UC Course  ?I have reviewed the triage vital signs and the nursing notes. ? ?Pertinent labs & imaging results that were available during my care of the patient were reviewed by me and considered in my medical decision making (see chart for details). ? ?  ?Use normal saline and irrigate the 2-3 times a day. ?Use medications as prescribed ?If symptoms become worse or you have increased blurred vision to that on you will need to be seen in the emergency room ?Take Tylenol and Motrin as needed for pain ?Tetracaine and Woods lamp used to visualize corneal abrasion to right lower lateral right. ? ?Final Clinical Impressions(s) / UC Diagnoses  ? ?Final diagnoses:  ?Abrasion of right cornea, subsequent encounter  ? ? ? ?Discharge Instructions   ? ?  ?Use normal saline and irrigate the 2-3 times a day. ?Use medications as prescribed ?If symptoms become worse or you have increased blurred vision to that on you will need to be seen in the emergency room ?Take Tylenol and Motrin as needed for pain ?You will need to follow up with ent  ? ? ? ? ?ED Prescriptions   ? ? Medication Sig Dispense Auth. Provider  ? trimethoprim-polymyxin b (POLYTRIM) ophthalmic solution Place 2 drops into the right eye every 4 (four) hours. 10 mL Coralyn Mark, NP  ? ?  ? ?PDMP not reviewed this encounter. ?  ?Coralyn Mark, NP ?06/19/21 1950 ? ?  ?Coralyn Mark, NP ?06/19/21 1008 ? ?

## 2021-06-19 NOTE — Discharge Instructions (Addendum)
Use normal saline and irrigate the 2-3 times a day. ?Use medications as prescribed ?If symptoms become worse or you have increased blurred vision to that on you will need to be seen in the emergency room ?Take Tylenol and Motrin as needed for pain ?You will need to follow up with ent  ?

## 2021-08-13 ENCOUNTER — Other Ambulatory Visit: Payer: Self-pay | Admitting: Orthopedic Surgery

## 2021-08-13 DIAGNOSIS — G8929 Other chronic pain: Secondary | ICD-10-CM

## 2021-08-23 ENCOUNTER — Ambulatory Visit
Admission: RE | Admit: 2021-08-23 | Discharge: 2021-08-23 | Disposition: A | Discharge status: Home / Self Care | Payer: BC Managed Care – PPO | Source: Ambulatory Visit | Attending: Orthopedic Surgery | Admitting: Orthopedic Surgery

## 2021-08-23 DIAGNOSIS — G8929 Other chronic pain: Secondary | ICD-10-CM | POA: Insufficient documentation

## 2021-08-23 DIAGNOSIS — M25512 Pain in left shoulder: Secondary | ICD-10-CM | POA: Insufficient documentation

## 2021-08-23 IMAGING — MR MR SHOULDER*L* W/O CM
4 of 5 series · 26 of 40 positions shown · non-contrast
Comparison: [DATE]

CLINICAL DATA: Chronic shoulder pain and limited range of motion,
worsening over the past 3-6 months.

EXAM:
MRI OF THE LEFT SHOULDER WITHOUT CONTRAST
TECHNIQUE: Multiplanar, multisequence MR imaging of the shoulder was performed.
No intravenous contrast was administered.

[Series 5: T2 fat-sat · axial · left · 4.0mm · 0.44mm/px · z∈[-58,+57]mm · 8 of 26 slices shown (1 of 3)]
[im 1/26]
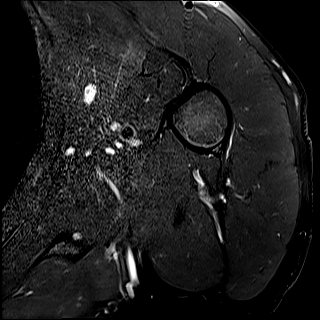
[im 4/26]
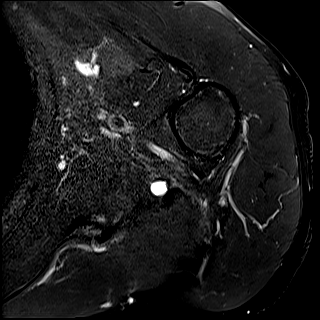
[im 8/26]
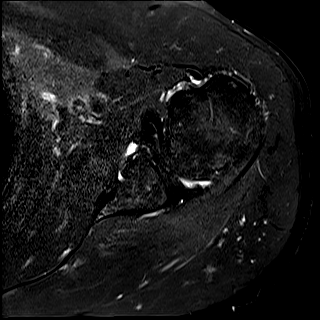
[im 11/26]
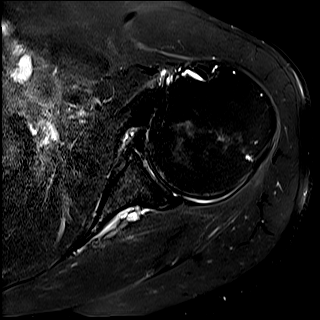
[im 15/26]
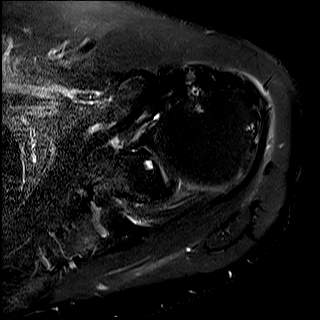
[im 18/26]
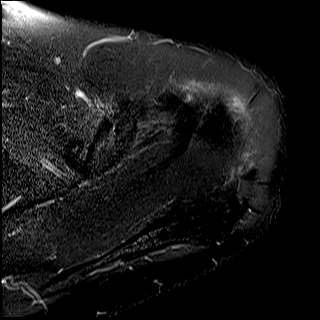
[im 22/26]
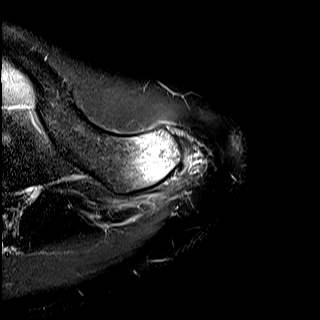
[im 26/26]
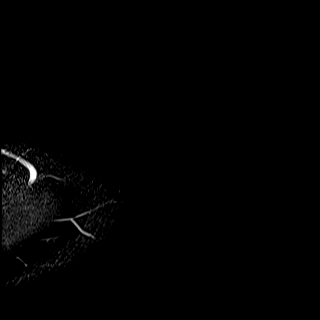

[Series 8: T2 fat-sat · oblique · left · 4.0mm · 0.22mm/px · 6 of 22 slices shown (2 of 3)]
[im 1/22]
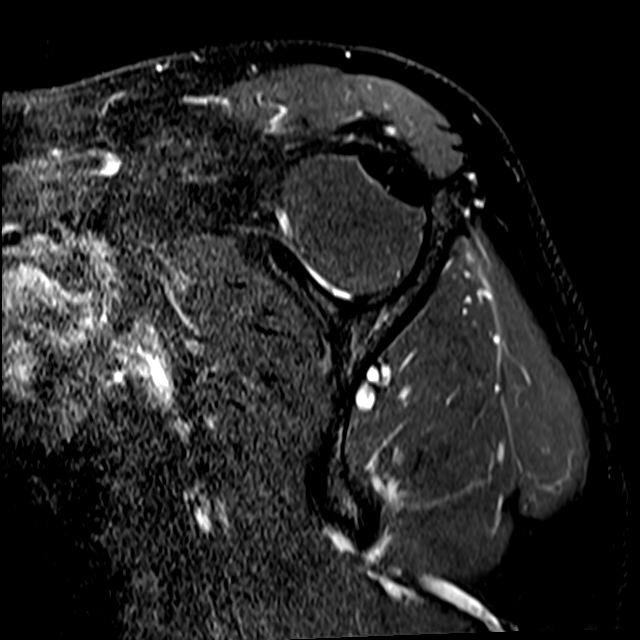
[im 4/22]
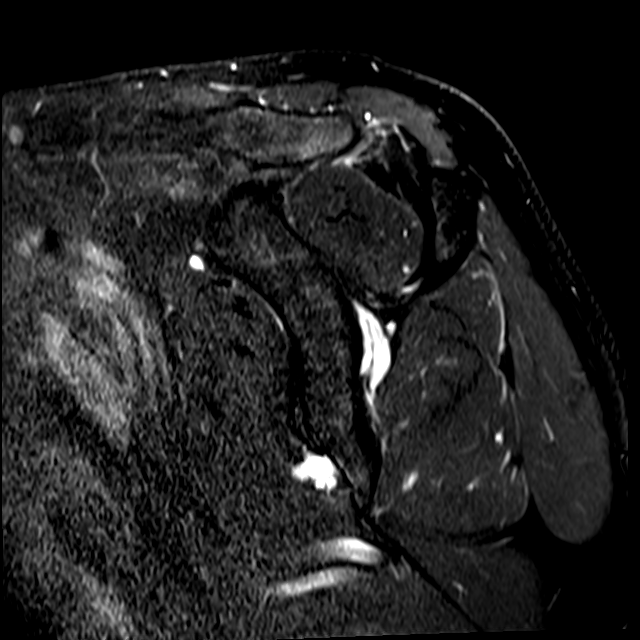
[im 8/22]
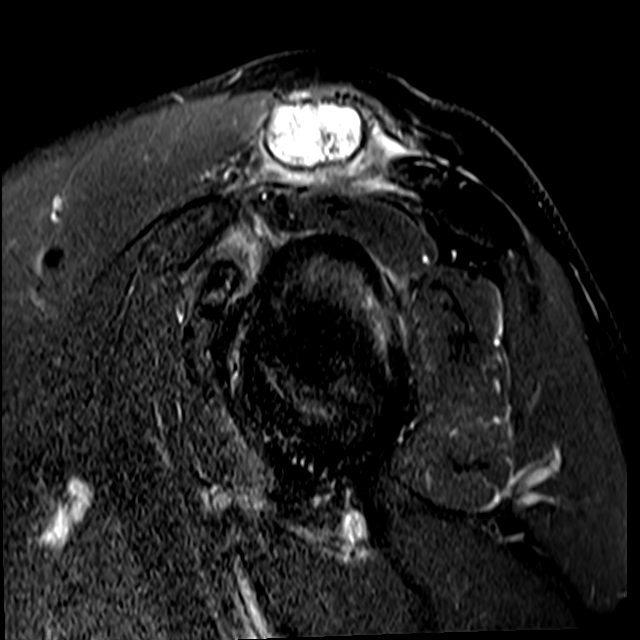
[im 11/22]
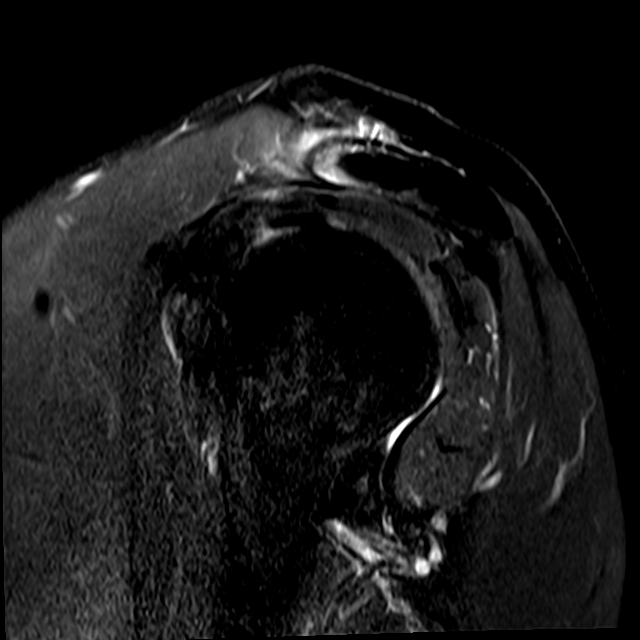
[im 15/22]
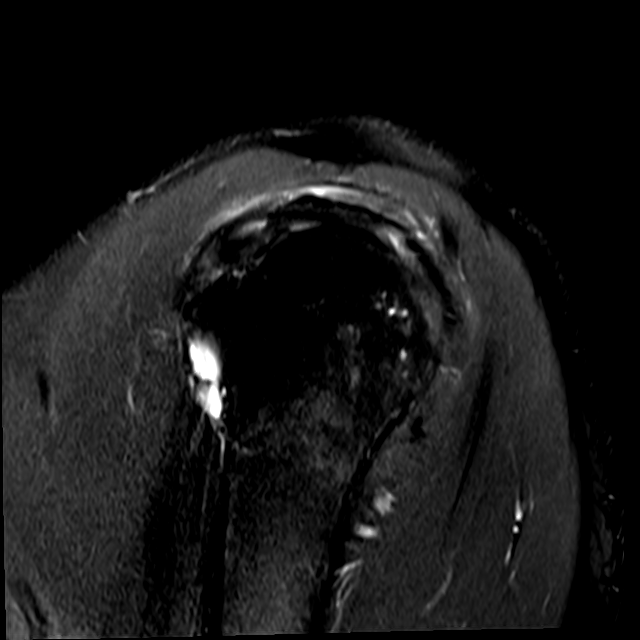
[im 18/22]
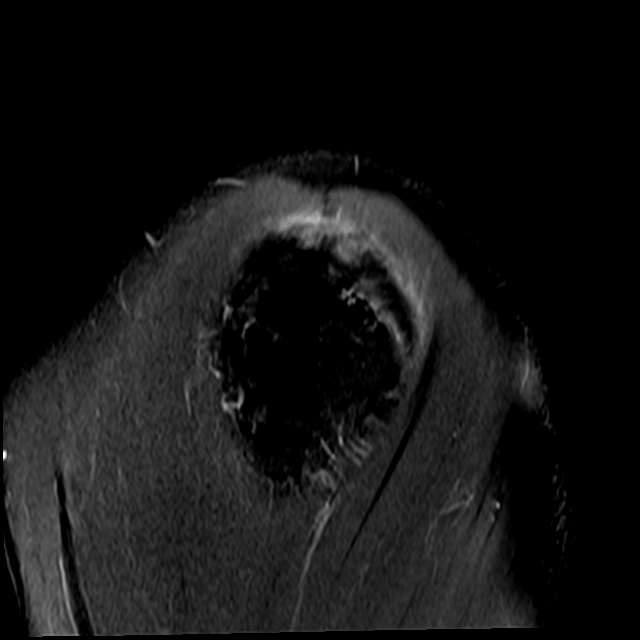

[Series 1008: PD · oblique · left · 4.0mm · 0.44mm/px · 9 of 26 slices shown]
[im 1/26]
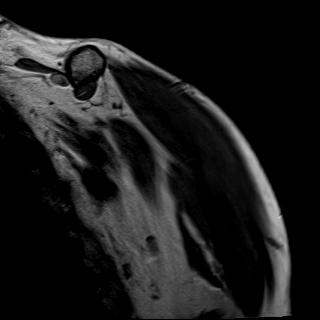
[im 4/26]
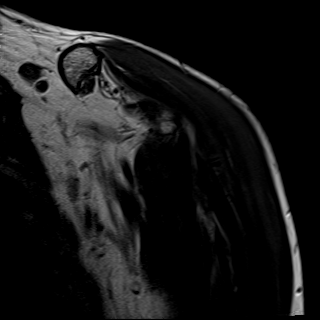
[im 7/26]
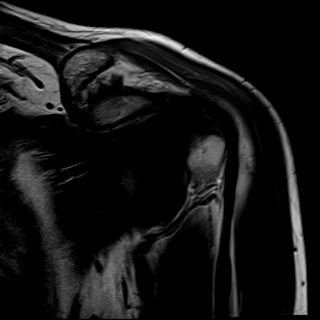
[im 10/26]
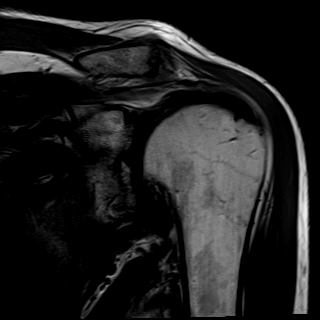
[im 13/26]
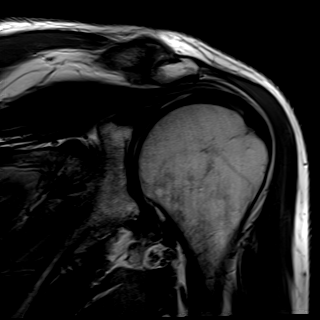
[im 16/26]
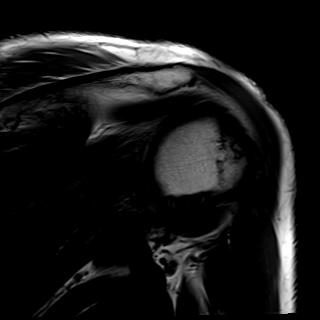
[im 19/26]
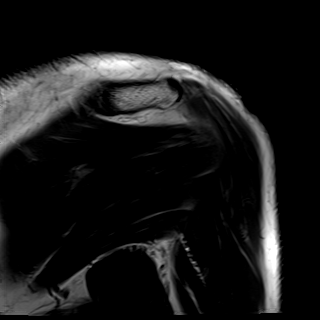
[im 22/26]
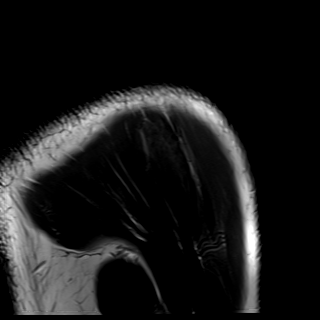
[im 26/26]
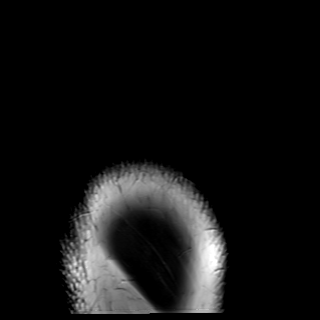

[Series 1016: T2 fat-sat · oblique · left · 4.0mm · 0.44mm/px · 3 of 26 slices shown (3 of 3)]
[im 4/26]
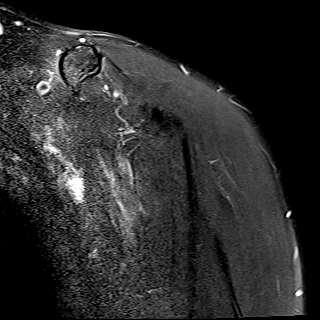
[im 13/26]
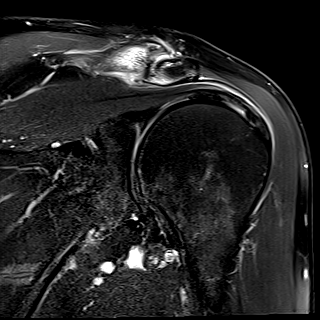
[im 22/26]
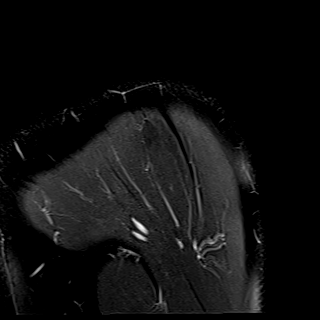

[26 of 40 positions shown; findings below may reference images not displayed]

FINDINGS: Rotator cuff: Mild supraspinatus tendinopathy. Minimal partial
thickness articular surface tearing of the posterior supraspinatus
tendon distally. This is not substantially changed from [DATE].

Muscles:  Unremarkable

Biceps long head:  Unremarkable

Acromioclavicular Joint: Prominently increased abnormal marrow edema
along the AC joint compared to the [DATE] exam, particularly in
the distal clavicle but also with some endosteal edema along the
acromion, and surrounding low-level soft tissue edema. Spurring
noted. Type II acromion. No significant regional bursitis.

Glenohumeral Joint: Moderate degenerative chondral thinning in the
glenohumeral joint with associated spurring of the humeral head and
inferior glenoid. Mild subcortical cyst formation in the superior
glenoid similar to prior.

Labrum: Extending from the inferior and anterior inferior labrum
there are small serpentine fluid collections which could reflect
small paralabral cysts for example on images 18 through 24 of series
5, a small ganglion cyst is a differential diagnostic consideration.
This raises the possibility of otherwise occult inferior labral
tear.

Bones:  No additional significant bony findings.

Other: No supplemental non-categorized findings.
IMPRESSION: 1. Prominent increase in AC joint arthropathy with subcortical
marrow edema along the AC joint and surrounding mild soft tissue
edema. No overt fluid collection in this vicinity. Although septic
arthritis is not favored due to the chronicity of the patient's
symptoms, correlate with any symptoms such as fever or leukocytosis
in further assessing the possibility of infectious component.
2. Small serpentine fluid collection extending along the inferior
labrum to the inferior glenoid neck region and separately below the
glenoid, raising the possibility of small paralabral cysts
associated with inferior labral tear.
3. Similar appearance of mild partial thickness articular surface
tearing of the distal supraspinatus tendon and mild supraspinatus
tendinopathy.
4. Mild degenerative glenohumeral arthropathy.

## 2021-08-27 ENCOUNTER — Encounter: Payer: Self-pay | Admitting: Orthopedic Surgery

## 2021-08-27 ENCOUNTER — Other Ambulatory Visit: Payer: Self-pay | Admitting: Orthopedic Surgery

## 2021-08-29 ENCOUNTER — Encounter
Admission: RE | Disposition: A | Discharge status: Home / Self Care | Payer: Self-pay | Source: Home / Self Care | Attending: Orthopedic Surgery

## 2021-08-29 ENCOUNTER — Ambulatory Visit: Payer: BC Managed Care – PPO | Admitting: Anesthesiology

## 2021-08-29 ENCOUNTER — Ambulatory Visit
Admission: RE | Admit: 2021-08-29 | Discharge: 2021-08-29 | Disposition: A | Discharge status: Home / Self Care | Payer: BC Managed Care – PPO | Attending: Orthopedic Surgery | Admitting: Orthopedic Surgery

## 2021-08-29 ENCOUNTER — Other Ambulatory Visit: Payer: Self-pay

## 2021-08-29 ENCOUNTER — Encounter: Payer: Self-pay | Admitting: Orthopedic Surgery

## 2021-08-29 DIAGNOSIS — M75112 Incomplete rotator cuff tear or rupture of left shoulder, not specified as traumatic: Secondary | ICD-10-CM | POA: Diagnosis not present

## 2021-08-29 DIAGNOSIS — M25812 Other specified joint disorders, left shoulder: Secondary | ICD-10-CM | POA: Insufficient documentation

## 2021-08-29 DIAGNOSIS — F17200 Nicotine dependence, unspecified, uncomplicated: Secondary | ICD-10-CM | POA: Insufficient documentation

## 2021-08-29 DIAGNOSIS — M19012 Primary osteoarthritis, left shoulder: Secondary | ICD-10-CM | POA: Insufficient documentation

## 2021-08-29 HISTORY — DX: Unspecified hearing loss, right ear: H91.91

## 2021-08-29 SURGERY — SHOULDER ARTHROSCOPY WITH SUBACROMIAL DECOMPRESSION AND DISTAL CLAVICLE EXCISION
Anesthesia: Regional | Site: Shoulder | Laterality: Left

## 2021-08-29 MED ORDER — CEFAZOLIN SODIUM-DEXTROSE 2-4 GM/100ML-% IV SOLN
2.0000 g | INTRAVENOUS | Status: AC
  Administered 2021-08-29: 2 g via INTRAVENOUS

## 2021-08-29 MED ORDER — LIDOCAINE HCL (CARDIAC) PF 100 MG/5ML IV SOSY
PREFILLED_SYRINGE | INTRAVENOUS | Status: DC | PRN
Start: 2021-08-29 — End: 2021-08-29
  Administered 2021-08-29: 50 mg via INTRATRACHEAL

## 2021-08-29 MED ORDER — FENTANYL CITRATE (PF) 100 MCG/2ML IJ SOLN
INTRAMUSCULAR | Status: DC | PRN
  Administered 2021-08-29: 100 ug via INTRAVENOUS

## 2021-08-29 MED ORDER — LACTATED RINGERS IR SOLN
Status: DC | PRN
  Administered 2021-08-29 (×2): 6000 mL
  Administered 2021-08-29: 12000 mL
  Administered 2021-08-29: 6000 mL

## 2021-08-29 MED ORDER — ACETAMINOPHEN 500 MG PO TABS: 1000.0000 mg | ORAL_TABLET | Freq: Three times a day (TID) | ORAL | 2 refills | Status: AC

## 2021-08-29 MED ORDER — BUPIVACAINE LIPOSOME 1.3 % IJ SUSP
INTRAMUSCULAR | Status: DC | PRN
  Administered 2021-08-29: 266 mg

## 2021-08-29 MED ORDER — BUPIVACAINE HCL (PF) 0.5 % IJ SOLN
INTRAMUSCULAR | Status: DC | PRN
  Administered 2021-08-29: 75 mg

## 2021-08-29 MED ORDER — LACTATED RINGERS IV SOLN: INTRAVENOUS | Status: DC

## 2021-08-29 MED ORDER — ASPIRIN 325 MG PO TBEC: 325.0000 mg | DELAYED_RELEASE_TABLET | Freq: Every day | ORAL | 0 refills | Status: AC

## 2021-08-29 MED ORDER — HYDROMORPHONE HCL 1 MG/ML IJ SOLN: 0.2500 mg | INTRAMUSCULAR | Status: DC | PRN

## 2021-08-29 MED ORDER — GLYCOPYRROLATE 0.2 MG/ML IJ SOLN
INTRAMUSCULAR | Status: DC | PRN
Start: 2021-08-29 — End: 2021-08-29
  Administered 2021-08-29: .1 mg via INTRAVENOUS

## 2021-08-29 MED ORDER — MIDAZOLAM HCL 5 MG/5ML IJ SOLN
INTRAMUSCULAR | Status: DC | PRN
  Administered 2021-08-29: 2 mg via INTRAVENOUS

## 2021-08-29 MED ORDER — BUPIVACAINE HCL (PF) 0.25 % IJ SOLN
INTRAMUSCULAR | Status: DC | PRN
  Administered 2021-08-29: 25 mg via EPIDURAL

## 2021-08-29 MED ORDER — PROCHLORPERAZINE EDISYLATE 10 MG/2ML IJ SOLN: 5.0000 mg | INTRAMUSCULAR | Status: DC | PRN

## 2021-08-29 MED ORDER — OXYCODONE HCL 5 MG/5ML PO SOLN: 5.0000 mg | Freq: Once | ORAL | Status: DC | PRN

## 2021-08-29 MED ORDER — ONDANSETRON 4 MG PO TBDP: 4.0000 mg | ORAL_TABLET | Freq: Three times a day (TID) | ORAL | 0 refills | Status: DC | PRN

## 2021-08-29 MED ORDER — PROPOFOL 10 MG/ML IV BOLUS
INTRAVENOUS | Status: DC | PRN
  Administered 2021-08-29: 200 mg via INTRAVENOUS

## 2021-08-29 MED ORDER — LACTATED RINGERS IV SOLN
INTRAVENOUS | Status: DC | PRN
  Administered 2021-08-29: 4 mL

## 2021-08-29 MED ORDER — ONDANSETRON HCL 4 MG/2ML IJ SOLN
INTRAMUSCULAR | Status: DC | PRN
  Administered 2021-08-29: 4 mg via INTRAVENOUS

## 2021-08-29 MED ORDER — OXYCODONE HCL 5 MG PO TABS
5.0000 mg | ORAL_TABLET | ORAL | 0 refills | Status: DC | PRN
Start: 2021-08-29 — End: 2022-03-27

## 2021-08-29 MED ORDER — OXYCODONE HCL 5 MG PO TABS: 5.0000 mg | ORAL_TABLET | Freq: Once | ORAL | Status: DC | PRN

## 2021-08-29 MED ORDER — DEXAMETHASONE SODIUM PHOSPHATE 4 MG/ML IJ SOLN
INTRAMUSCULAR | Status: DC | PRN
Start: 2021-08-29 — End: 2021-08-29
  Administered 2021-08-29: 4 mg via INTRAVENOUS

## 2021-08-29 SURGICAL SUPPLY — 52 items
ADAPTER IRRIG TUBE 2 SPIKE SOL (ADAPTER) ×4 IMPLANT
ADPR TBG 2 SPK PMP STRL ASCP (ADAPTER) ×2
ANCH SUT 2.9 PUSHLOCK ANCH (Orthopedic Implant) ×1 IMPLANT
ANCH SUT BN ASCP DLV (Anchor) ×1 IMPLANT
ANCH SUT RGNRT REGENETEN (Staple) ×1 IMPLANT
ANCHOR BONE REGENETEN (Anchor) ×1 IMPLANT
ANCHOR TENDON REGENETEN (Staple) ×1 IMPLANT
APL PRP STRL LF DISP 70% ISPRP (MISCELLANEOUS) ×1
BLADE SHAVER 4.5X7 STR FR (MISCELLANEOUS) ×2 IMPLANT
BUR BR 5.5 WIDE MOUTH (BURR) ×2 IMPLANT
CANNULA PART THRD DISP 5.75X7 (CANNULA) ×1 IMPLANT
CHLORAPREP W/TINT 26 (MISCELLANEOUS) ×2 IMPLANT
COOLER POLAR GLACIER W/PUMP (MISCELLANEOUS) ×2 IMPLANT
COVER LIGHT HANDLE UNIVERSAL (MISCELLANEOUS) ×4 IMPLANT
DRAPE INCISE IOBAN 66X45 STRL (DRAPES) ×2 IMPLANT
DRAPE U-SHAPE 48X52 POLY STRL (PACKS) ×2 IMPLANT
DRSG TEGADERM 4X4.75 (GAUZE/BANDAGES/DRESSINGS) ×6 IMPLANT
ELECT REM PT RETURN 9FT ADLT (ELECTROSURGICAL) ×2
ELECTRODE REM PT RTRN 9FT ADLT (ELECTROSURGICAL) IMPLANT
GAUZE SPONGE 4X4 12PLY STRL (GAUZE/BANDAGES/DRESSINGS) ×2 IMPLANT
GAUZE XEROFORM 1X8 LF (GAUZE/BANDAGES/DRESSINGS) ×2 IMPLANT
GLOVE SRG 8 PF TXTR STRL LF DI (GLOVE) ×3 IMPLANT
GLOVE SURG ENC MOIS LTX SZ7.5 (GLOVE) ×8 IMPLANT
GLOVE SURG UNDER POLY LF SZ8 (GLOVE) ×6
GOWN STRL REIN 2XL XLG LVL4 (GOWN DISPOSABLE) ×2 IMPLANT
GOWN STRL REUS W/ TWL LRG LVL3 (GOWN DISPOSABLE) ×3 IMPLANT
GOWN STRL REUS W/TWL LRG LVL3 (GOWN DISPOSABLE) ×6
IMPL REGENETEN MEDIUM (Shoulder) IMPLANT
IMPLANT REGENETEN MEDIUM (Shoulder) ×2 IMPLANT
IV LACTATED RINGER IRRG 3000ML (IV SOLUTION) ×12
IV LR IRRIG 3000ML ARTHROMATIC (IV SOLUTION) ×6 IMPLANT
KIT DISPOSABLE PUSHLOCK 2.9MM (KITS) ×1 IMPLANT
KIT STABILIZATION SHOULDER (MISCELLANEOUS) ×2 IMPLANT
KIT TURNOVER KIT A (KITS) ×2 IMPLANT
MANIFOLD NEPTUNE II (INSTRUMENTS) ×2 IMPLANT
MASK FACE SPIDER DISP (MASK) ×2 IMPLANT
MAT GRAY ABSORB FLUID 28X50 (MISCELLANEOUS) ×4 IMPLANT
PACK ARTHROSCOPY SHOULDER (MISCELLANEOUS) ×2 IMPLANT
PAD ABD DERMACEA PRESS 5X9 (GAUZE/BANDAGES/DRESSINGS) ×4 IMPLANT
PAD WRAPON POLAR SHDR XLG (MISCELLANEOUS) ×1 IMPLANT
SPONGE T-LAP 18X18 ~~LOC~~+RFID (SPONGE) ×2 IMPLANT
SUT ETHILON 3-0 FS-10 30 BLK (SUTURE) ×2
SUT PROLENE 0 CT 2 (SUTURE) ×2 IMPLANT
SUT VIC AB 0 CT1 36 (SUTURE) ×2 IMPLANT
SUTURE EHLN 3-0 FS-10 30 BLK (SUTURE) ×1 IMPLANT
SYSTEM IMPL TENODESIS LNT 2.9 (Orthopedic Implant) ×1 IMPLANT
TAPE MICROFOAM 4IN (TAPE) ×2 IMPLANT
TUBING CONNECTING 10 (TUBING) ×2 IMPLANT
TUBING INFLOW SET DBFLO PUMP (TUBING) ×2 IMPLANT
TUBING OUTFLOW SET DBLFO PUMP (TUBING) ×2 IMPLANT
WAND WEREWOLF FLOW 90D (MISCELLANEOUS) ×2 IMPLANT
WRAPON POLAR PAD SHDR XLG (MISCELLANEOUS) ×2

## 2021-08-29 NOTE — H&P (Signed)
Paper H&P to be scanned into permanent record. H&P reviewed. No significant changes noted.  

## 2021-08-29 NOTE — Anesthesia Procedure Notes (Signed)
Anesthesia Regional Block: Interscalene brachial plexus block (+intercostobrachial)   Pre-Anesthetic Checklist: , timeout performed,  Correct Patient, Correct Site, Correct Laterality,  Correct Procedure, Correct Position, site marked,  Risks and benefits discussed,  Surgical consent,  Pre-op evaluation,  At surgeon's request and post-op pain management  Laterality: Left  Prep: chloraprep       Needles:  Injection technique: Single-shot  Needle Type: Stimiplex      Needle Gauge: 20     Additional Needles:   Procedures:,,,, ultrasound used (permanent image in chart),,    Narrative:  Start time: 08/29/2021 12:07 PM End time: 08/29/2021 12:13 PM Injection made incrementally with aspirations every 5 mL.  Performed by: Personally  Anesthesiologist: April Manson, MD  Additional Notes: 75mg  0.5% bupiv + 266mg  exparel to interscalene plexus 25mg  0.25% bupiv field block for intercostobrachial coverage

## 2021-08-29 NOTE — Anesthesia Preprocedure Evaluation (Signed)
Anesthesia Evaluation  Patient identified by MRN, date of birth, ID band Patient awake    Reviewed: Allergy & Precautions, H&P , NPO status , Patient's Chart, lab work & pertinent test results, reviewed documented beta blocker date and time   Airway Mallampati: II  TM Distance: >3 FB Neck ROM: Full    Dental no notable dental hx.    Pulmonary neg pulmonary ROS, Current Smoker and Patient abstained from smoking.,    Pulmonary exam normal breath sounds clear to auscultation       Cardiovascular Exercise Tolerance: Good negative cardio ROS Normal cardiovascular exam Rhythm:Regular Rate:Normal     Neuro/Psych negative neurological ROS  negative psych ROS   GI/Hepatic negative GI ROS, Neg liver ROS,   Endo/Other  negative endocrine ROS  Renal/GU negative Renal ROS  negative genitourinary   Musculoskeletal   Abdominal   Peds  Hematology negative hematology ROS (+)   Anesthesia Other Findings Right ear deafness  Reproductive/Obstetrics negative OB ROS                             Anesthesia Physical Anesthesia Plan  ASA: 2  Anesthesia Plan: General and Regional   Post-op Pain Management:    Induction:   PONV Risk Score and Plan: 1 and Treatment may vary due to age or medical condition  Airway Management Planned:   Additional Equipment:   Intra-op Plan:   Post-operative Plan:   Informed Consent: I have reviewed the patients History and Physical, chart, labs and discussed the procedure including the risks, benefits and alternatives for the proposed anesthesia with the patient or authorized representative who has indicated his/her understanding and acceptance.     Dental Advisory Given  Plan Discussed with: CRNA  Anesthesia Plan Comments:         Anesthesia Quick Evaluation

## 2021-08-29 NOTE — Op Note (Signed)
SURGERY DATE: 08/29/2021   PRE-OP DIAGNOSIS:  1. Left partial thickness rotator cuff tear 2. Left biceps tendinopathy 3. Left subacromial impingement 4. Left acromioclavicular joint arthritis  POST-OP DIAGNOSIS: 1. Left partial thickness rotator cuff tear 2. Left biceps tendinopathy 3. Left subacromial impingement 4. Left acromioclavicular joint arthritis  PROCEDURES:  1. Left arthroscopic rotator cuff repair using Regeneten patch 2. Left arthroscopic biceps tenodesis 3. Left extensive debridement of shoulder (glenohumeral and subacromial spaces) 4. Left arthroscopic distal clavicle excision 5. Left arthroscopic subacromial decompression  SURGEON: Todd Albee, MD  ASSISTANT: Todd Aus, PA-S   ANESTHESIA: Gen with interscalene block w/Exparel  ESTIMATED BLOOD LOSS: 5cc  DRAINS:  none  TOTAL IV FLUIDS: per anesthesia   SPECIMENS: none  IMPLANTS:  - [employer] Regeneten patch with associated tendon and bone staples - Arthrex 2.25mm PushLock anchor (arthroscopic biceps tenodesis)  OPERATIVE FINDINGS:  Examination under anesthesia: A careful examination under anesthesia was performed.  Passive range of motion was: FF: 160; ER at side: 60; ER in abduction: 95; IR in abduction: 45.  Anterior load shift: NT.  Posterior load shift: NT.  Sulcus in neutral: NT.  Sulcus in ER: NT.    Intra-operative findings: A thorough arthroscopic examination of the shoulder was performed. The findings are: 1. Biceps tendon: Mild tendinopathy with erythema at the biceps anchor and mild longitudinal tearing of the biceps 2. Superior labrum: Type 2 SLAP tear 3. Posterior labrum and capsule: Degenerative labral tissue 4. Inferior capsule and inferior recess: normal 5. Glenoid cartilage surface: Normal 6. Supraspinatus attachment: partial thickness tearing of almost the entire supraspinatus involving approximately 50% of the articular surface  7. Posterior rotator cuff attachment: normal   8. Humeral head articular cartilage: normal 9. Rotator interval: Synovitic 10: Subscapularis tendon: Normal 11. Anterior labrum: degenerative tearing 12. IGHL: normal  OPERATIVE REPORT:   Indications for procedure: Todd Hancock is a 52 y.o. male with over 2 months of shoulder pain.  He has had prior corticosteroid injections, medications, attempted to modify his activities, and eventually had to stop working due to his shoulder pain. Clinical exam and MRI were significant for significant AC joint arthritis and bone marrow edema about the [facility] joint, partial thickness rotator cuff tear, subacromial impingement/bursitis, and biceps tendinopathy. After discussion of risks, benefits, and alternatives to surgery, the patient elected to proceed with above mentioned procedure. The patient understands that use of the Regeneten patch is relatively new and long-term data is unknown.  Procedure in detail:  I identified Todd Hancock in the pre-operative holding area.  I marked the operative shoulder with my initials. I reviewed the risks and benefits of the proposed surgical intervention, and the patient wished to proceed.  Anesthesia was then performed with an interscalene block with Exparel.  The patient was transferred to the operative suite and placed in the beach chair position.    Appropriate IV antibiotics were administered. The operative upper extremity was then prepped and draped in standard fashion. A time out was performed confirming the correct extremity, correct patient, and correct procedure.   I then created a standard posterior portal with an 11 blade. The glenohumeral joint was easily entered with a blunt trochar and the arthroscope introduced. The findings of diagnostic arthroscopy are described above. I debrided the degenerative anterior labrum, superior labrum, posterior labrum, and also debrided and coagulated the inflamed synovium to obtain hemostasis and reduce the risk of  post-operative swelling using an Arthrocare radiofrequency device.  I then turned my attention to the arthroscopic biceps tenodesis.  I used the loop n tack technique to pass a fiber tape through the biceps in a locked fashion adjacent to the biceps anchor.  The biceps tendon was cut at its attachment on the superior labrum.  The remnant stump was debrided down to a stable base on the biceps anchor complex using an oscillating shaver.  A hole for a 2.9 mm Arthrex PushLock was drilled in the bicipital groove just superior to the subscapularis tendon insertion.  The fiber tape was loaded onto the PushLock anchor and impacted into place into the previously drilled hole in the bicipital groove.  This appropriately secured the biceps into the bicipital groove and took it off of tension.  An oscillating shaver was then used to debride the frayed portion of the supraspinatus in the area of the partial-thickness tear on the articular side.  A spinal needle was placed anteriorly and posteriorly to mark the borders of the partial thickness rotator cuff tear and an 0-PDS suture was passed through the needle and retrieved out of the anterior portal.  Next, the arthroscope was then introduced into the subacromial space. A direct lateral portal was created with an 11-blade after spinal needle localization. An extensive subacromial bursectomy was performed using a combination of the shaver and Arthrocare wand. The entire acromial undersurface was exposed and the CA ligament was subperiosteally elevated to expose the prominent anterior acromial hook. A burr was used to create a flat anterior and lateral aspect of the acromion, converting it from a Type 2 to a Type 1 acromion. Care was made to keep the deltoid fascia intact.  Then, I exposed the acromioclavicular joint using a combination of shaver and arthrocare wand. The distal 10mm of clavicle was removed using a 5.83mm burr (2 burr widths removed). Adequate resection  was confirmed by placing the camera into the anterior portal and by using a probe to measure the distance between the acromion and distal clavicle. Care was taken to preserve the superior and posterior capsule.   Next, a switching stick was used to probe the bursal side of the rotator cuff in the region of the previously passed 0 PDS suture.  The bursal side of the rotator cuff was intact as the switching stick could not be pushed through into the glenohumeral joint.  This confirmed appropriate partial-thickness nature of the tear at the musculotendinous junction. Given this, we decided to proceed with Regeneten patch placement. The Regeneten patch delivery gun was placed appropriately and the patch was delivered over the supraspinatus tendon using the PDS suture as a reference. Tendon staples were placed medially, anteriorly, and posteriorly after making appropriate stab incisions for portal placement. Two bone staples were then placed laterally. The patch was then probed to confirm appropriate stability.  This completed the rotator cuff repair of the partial-thickness tear.  Arthroscopic fluid was evacuated from the joint.  The portals were closed with 3-0 Nylon. Xeroform was applied to the portals. A sterile dressing was applied, followed by a Polar Care sleeve and a SlingShot shoulder immobilizer/sling. The patient awoke from anesthesia without difficulty and was transferred to the PACU in stable condition.   COMPLICATIONS: none  DISPOSITION: plan for discharge home after recovery in PACU  POSTOPERATIVE PLAN: Remain in sling (except hygiene and elbow/wrist/hand RoM exercises as instructed by PT) x 2 weeks and NWB for this time. Wean from sling as tolerated after 2 weeks.  PT to begin 3-7 days after  surgery. Use Regeneten Patch rehab protocol: For the first 4 weeks, forward flexion is limited to 100. External rotation with the arm by the side is allowed, but abduction-external rotation is not allowed  for the first 6 weeks. After 6 weeks, no restrictions on motion or arm use.  ASA for DVT prophylaxis.  Follow-up as an outpatient as scheduled in approximately 2 weeks.  Avoid cross body adduction for 8 weeks postoperatively

## 2021-08-29 NOTE — Transfer of Care (Signed)
Immediate Anesthesia Transfer of Care Note  Patient: Todd Hancock  Procedure(s) Performed: Left shoulder arthroscopic subacromial decompression, distal clavicle excision, biceps tenodesis,  Regeneten patch application (Left)  Patient Location: PACU  Anesthesia Type: General, Regional  Level of Consciousness: awake, alert  and patient cooperative  Airway and Oxygen Therapy: Patient Spontanous Breathing and Patient connected to supplemental oxygen  Post-op Assessment: Post-op Vital signs reviewed, Patient's Cardiovascular Status Stable, Respiratory Function Stable, Patent Airway and No signs of Nausea or vomiting  Post-op Vital Signs: Reviewed and stable  Complications: No notable events documented.

## 2021-08-29 NOTE — Anesthesia Postprocedure Evaluation (Signed)
Anesthesia Post Note  Patient: Todd Hancock  Procedure(s) Performed: Left shoulder arthroscopic subacromial decompression, distal clavicle excision, biceps tenodesis,  Regeneten patch application (Left: Shoulder)     Patient location during evaluation: PACU Anesthesia Type: Regional and General Level of consciousness: awake and alert Pain management: pain level controlled Vital Signs Assessment: post-procedure vital signs reviewed and stable Respiratory status: spontaneous breathing, nonlabored ventilation and respiratory function stable Cardiovascular status: blood pressure returned to baseline and stable Postop Assessment: no apparent nausea or vomiting Anesthetic complications: no   No notable events documented.  April Manson

## 2021-08-29 NOTE — Discharge Instructions (Addendum)
Post-Op Instructions - Arthroscopic shoulder surgery (Regeneten Patch, biceps tenodesis, distal clavicle excision)  1. Bracing: You will wear a shoulder immobilizer or sling for 2 weeks.   2. Driving: No driving for at least 2 weeks post-op.   3. Activity: Progress to motion as tolerated, moving from passive to active-assisted to active motion. For the first 4 weeks, forward flexion is limited to 100. External rotation with the arm by the side is allowed, but abduction-external rotation is not allowed for the first 6 weeks. After 6 weeks, no restrictions on motion or arm use. Return to normal activities normally takes 4-6 months on average. If rehab goes very well, may be able to do most activities at 4 months, except overhead or contact sports.  Avoid reaching across the body for at least 8 weeks  4. Physical Therapy: Begins 3-7 days after surgery. Physical therapist will remove dressings and you may shower after this if incisions appear to be healing well  5. Medications:  - You will be provided a prescription for narcotic pain medicine. After surgery, take 1-2 narcotic tablets every 4 hours if needed for severe pain.  - A prescription for anti-nausea medication will be provided in case the narcotic medicine causes nausea - take 1 tablet every 6 hours only if nauseated.   - Take tylenol 1000 mg (2 Extra Strength tablets or 3 regular strength) every 8 hours for pain.  May decrease or stop tylenol 5 days after surgery if you are having minimal pain. - Take ASA 325mg /day x 2 weeks to help prevent DVTs/PEs (blood clots).    If you are taking prescription medication for anxiety, depression, insomnia, muscle spasm, chronic pain, or for attention deficit disorder, you are advised that you are at a higher risk of adverse effects with use of narcotics post-op, including narcotic addiction/dependence, depressed breathing, death. If you use non-prescribed substances: alcohol, marijuana, cocaine, heroin,  methamphetamines, etc., you are at a higher risk of adverse effects with use of narcotics post-op, including narcotic addiction/dependence, depressed breathing, death. You are advised that taking > 50 morphine milligram equivalents (MME) of narcotic pain medication per day results in twice the risk of overdose or death. For your prescription provided: oxycodone 5 mg - taking more than 6 tablets per day would result in > 50 morphine milligram equivalents (MME) of narcotic pain medication. Be advised that we will prescribe narcotics short-term, for acute post-operative pain only - 3 weeks for major operations such as shoulder repair/reconstruction surgeries.     6. Post-Op Appointment:  Your first post-op appointment will be 10-14 days post-op.  7. Work or School: For most, but not all procedures, we advise staying out of work or school for at least 1 to 2 weeks in order to recover from the stress of surgery and to allow time for healing.   If you need a work or school note this can be provided.   8. Smoking: If you are a smoker, you need to refrain from smoking in the postoperative period. The nicotine in cigarettes will inhibit healing of your shoulder repair and decrease the chance of successful repair. Similarly, nicotine containing products (gum, patches) should be avoided.   Post-operative Brace: Apply and remove the brace you received as you were instructed to at the time of fitting and as described in detail as the brace's instructions for use indicate.  Wear the brace for the period of time prescribed by your physician.  The brace can be cleaned with soap and  water and allowed to air dry only.  Should the brace result in increased pain, decreased feeling (numbness/tingling), increased swelling or an overall worsening of your medical condition, please contact your doctor immediately.  If an emergency situation occurs as a result of wearing the brace after normal business hours, please dial 911  and seek immediate medical attention.  Let your doctor know if you have any further questions about the brace issued to you. Refer to the shoulder sling instructions for use if you have any questions regarding the correct fit of your shoulder sling.  [facility] Customer Care for Troubleshooting: [phone]  Video that illustrates how to properly use a shoulder sling: "Instructions for Proper Use of an Orthopaedic Sling" http://bass.com/

## 2021-08-29 NOTE — Anesthesia Procedure Notes (Signed)
Procedure Name: LMA Insertion Date/Time: 08/29/2021 12:52 PM Performed by: Jimmy Picket, CRNA Pre-anesthesia Checklist: Patient identified, Emergency Drugs available, Suction available, Timeout performed and Patient being monitored Patient Re-evaluated:Patient Re-evaluated prior to induction Oxygen Delivery Method: Circle system utilized Preoxygenation: Pre-oxygenation with 100% oxygen Induction Type: IV induction LMA: LMA inserted LMA Size: 4.0 Number of attempts: 1 Placement Confirmation: positive ETCO2 and breath sounds checked- equal and bilateral Tube secured with: Tape

## 2022-03-05 ENCOUNTER — Other Ambulatory Visit: Payer: Self-pay | Admitting: Orthopedic Surgery

## 2022-03-05 DIAGNOSIS — S46012D Strain of muscle(s) and tendon(s) of the rotator cuff of left shoulder, subsequent encounter: Secondary | ICD-10-CM

## 2022-03-10 ENCOUNTER — Ambulatory Visit
Admission: RE | Admit: 2022-03-10 | Discharge: 2022-03-10 | Disposition: A | Discharge status: Home / Self Care | Payer: BC Managed Care – PPO | Source: Ambulatory Visit | Attending: Orthopedic Surgery | Admitting: Orthopedic Surgery

## 2022-03-10 DIAGNOSIS — S46012D Strain of muscle(s) and tendon(s) of the rotator cuff of left shoulder, subsequent encounter: Secondary | ICD-10-CM | POA: Insufficient documentation

## 2022-03-21 ENCOUNTER — Other Ambulatory Visit: Payer: Self-pay | Admitting: Orthopedic Surgery

## 2022-03-27 ENCOUNTER — Encounter
Admission: RE | Admit: 2022-03-27 | Discharge: 2022-03-27 | Disposition: A | Discharge status: Home / Self Care | Payer: BC Managed Care – PPO | Source: Ambulatory Visit | Attending: Orthopedic Surgery | Admitting: Orthopedic Surgery

## 2022-03-27 ENCOUNTER — Other Ambulatory Visit: Payer: Self-pay

## 2022-03-27 HISTORY — DX: Personal history of urinary calculi: Z87.442

## 2022-03-27 HISTORY — DX: Strain of muscle(s) and tendon(s) of the rotator cuff of left shoulder, initial encounter: S46.012A

## 2022-03-27 HISTORY — DX: Other specified disorders of tendon, left shoulder: M67.814

## 2022-03-27 HISTORY — DX: Unspecified osteoarthritis, unspecified site: M19.90

## 2022-03-27 NOTE — Patient Instructions (Addendum)
Your procedure is scheduled on: 04/01/22 - Tuesday Report to the Registration Desk on the 1st floor of the [facility]. To find out your arrival time, please call [phone] between 1PM - 3PM on: 03/31/22 - Monday If your arrival time is 6:00 am, do not arrive prior to that time as the [facility] entrance doors do not open until 6:00 am.  REMEMBER: Instructions that are not followed completely may result in serious medical risk, up to and including death; or upon the discretion of your surgeon and anesthesiologist your surgery may need to be rescheduled.  Do not eat food after midnight the night before surgery.  No gum chewing, lozengers or hard candies.  You may however, drink CLEAR liquids up to 2 hours before you are scheduled to arrive for your surgery. Do not drink anything within 2 hours of your scheduled arrival time.  Clear liquids include: - water  - apple juice without pulp - gatorade (not RED colors) - black coffee or tea (Do NOT add milk or creamers to the coffee or tea) Do NOT drink anything that is not on this list.  TAKE THESE MEDICATIONS THE MORNING OF SURGERY WITH A SIP OF WATER: NONE  One week prior to surgery: Stop Anti-inflammatories (NSAIDS) such as Advil, Aleve, Ibuprofen, Motrin, Naproxen, Naprosyn and Aspirin based products such as Excedrin, Goodys Powder, BC Powder.  Stop ANY OVER THE COUNTER supplements until after surgery.  You may take Tylenol if needed for pain up until the day of surgery.  No Alcohol for 24 hours before or after surgery.  No Smoking including e-cigarettes for 24 hours prior to surgery.  No chewable tobacco products for at least 6 hours prior to surgery.  No nicotine patches on the day of surgery.  Do not use any "recreational" drugs for at least a week prior to your surgery.  Please be advised that the combination of cocaine and anesthesia may have negative outcomes, up to and including death. If you test positive for cocaine,  your surgery will be cancelled.  On the morning of surgery brush your teeth with toothpaste and water, you may rinse your mouth with mouthwash if you wish. Do not swallow any toothpaste or mouthwash.  Use CHG Soap or wipes as directed on instruction sheet.  Do not wear jewelry, make-up, hairpins, clips or nail polish.  Do not wear lotions, powders, or perfumes.   Do not shave body from the neck down 48 hours prior to surgery just in case you cut yourself which could leave a site for infection.  Also, freshly shaved skin may become irritated if using the CHG soap.  Contact lenses, hearing aids and dentures may not be worn into surgery.  Do not bring valuables to the hospital. [facility] is not responsible for any missing/lost belongings or valuables.   Notify your doctor if there is any change in your medical condition (cold, fever, infection).  Wear comfortable clothing (specific to your surgery type) to the hospital.  After surgery, you can help prevent lung complications by doing breathing exercises.  Take deep breaths and cough every 1-2 hours. Your doctor may order a device called an Incentive Spirometer to help you take deep breaths. When coughing or sneezing, hold a pillow firmly against your incision with both hands. This is called "splinting." Doing this helps protect your incision. It also decreases belly discomfort.  If you are being admitted to the hospital overnight, leave your suitcase in the car. After surgery it  may be brought to your room.  If you are being discharged the day of surgery, you will not be allowed to drive home. You will need a responsible adult (18 years or older) to drive you home and stay with you that night.   If you are taking public transportation, you will need to have a responsible adult (18 years or older) with you. Please confirm with your physician that it is acceptable to use public transportation.   Please call the Pre-admissions Testing  Dept. at [phone] if you have any questions about these instructions.  Surgery Visitation Policy:  Patients undergoing a surgery or procedure may have two family members or support persons with them as long as the person is not COVID-19 positive or experiencing its symptoms.   Inpatient Visitation:    Visiting hours are 7 a.m. to 8 p.m. Up to four visitors are allowed at one time in a patient room. The visitors may rotate out with other people during the day. One designated support person (adult) may remain overnight.  Due to an increase in RSV and influenza rates and associated hospitalizations, children ages 54 and under will not be able to visit patients in [facility]. Masks continue to be strongly recommended.

## 2022-04-01 ENCOUNTER — Ambulatory Visit: Payer: BC Managed Care – PPO | Admitting: Certified Registered"

## 2022-04-01 ENCOUNTER — Other Ambulatory Visit: Payer: Self-pay

## 2022-04-01 ENCOUNTER — Ambulatory Visit: Payer: BC Managed Care – PPO

## 2022-04-01 ENCOUNTER — Encounter
Admission: RE | Disposition: A | Discharge status: Home / Self Care | Payer: Self-pay | Source: Home / Self Care | Attending: Orthopedic Surgery

## 2022-04-01 ENCOUNTER — Ambulatory Visit
Admission: RE | Admit: 2022-04-01 | Discharge: 2022-04-01 | Disposition: A | Discharge status: Home / Self Care | Payer: BC Managed Care – PPO | Attending: Orthopedic Surgery | Admitting: Orthopedic Surgery

## 2022-04-01 ENCOUNTER — Encounter: Payer: Self-pay | Admitting: Orthopedic Surgery

## 2022-04-01 DIAGNOSIS — M75102 Unspecified rotator cuff tear or rupture of left shoulder, not specified as traumatic: Secondary | ICD-10-CM | POA: Insufficient documentation

## 2022-04-01 DIAGNOSIS — M65812 Other synovitis and tenosynovitis, left shoulder: Secondary | ICD-10-CM | POA: Insufficient documentation

## 2022-04-01 DIAGNOSIS — M1990 Unspecified osteoarthritis, unspecified site: Secondary | ICD-10-CM | POA: Diagnosis not present

## 2022-04-01 DIAGNOSIS — F17200 Nicotine dependence, unspecified, uncomplicated: Secondary | ICD-10-CM | POA: Diagnosis not present

## 2022-04-01 HISTORY — PX: SHOULDER ARTHROSCOPY WITH OPEN ROTATOR CUFF REPAIR: SHX6092

## 2022-04-01 SURGERY — ARTHROSCOPY, SHOULDER WITH REPAIR, ROTATOR CUFF, OPEN
Anesthesia: General | Laterality: Left

## 2022-04-01 MED ORDER — BUPIVACAINE HCL (PF) 0.5 % IJ SOLN
INTRAMUSCULAR | Status: AC
  Filled 2022-04-01: qty 20

## 2022-04-01 MED ORDER — PROPOFOL 10 MG/ML IV BOLUS
INTRAVENOUS | Status: AC
  Filled 2022-04-01: qty 20

## 2022-04-01 MED ORDER — DEXMEDETOMIDINE HCL IN NACL 200 MCG/50ML IV SOLN
INTRAVENOUS | Status: DC | PRN
  Administered 2022-04-01: 8 ug via INTRAVENOUS

## 2022-04-01 MED ORDER — EPHEDRINE SULFATE (PRESSORS) 50 MG/ML IJ SOLN
INTRAMUSCULAR | Status: DC | PRN
  Administered 2022-04-01: 5 mg via INTRAVENOUS

## 2022-04-01 MED ORDER — BUPIVACAINE LIPOSOME 1.3 % IJ SUSP
INTRAMUSCULAR | Status: AC
  Filled 2022-04-01: qty 10

## 2022-04-01 MED ORDER — FAMOTIDINE 20 MG PO TABS: 20.0000 mg | ORAL_TABLET | Freq: Once | ORAL | Status: AC

## 2022-04-01 MED ORDER — PROPOFOL 10 MG/ML IV BOLUS
INTRAVENOUS | Status: DC | PRN
  Administered 2022-04-01: 20 mg via INTRAVENOUS
  Administered 2022-04-01: 200 mg via INTRAVENOUS

## 2022-04-01 MED ORDER — FENTANYL CITRATE (PF) 100 MCG/2ML IJ SOLN: 25.0000 ug | INTRAMUSCULAR | Status: DC | PRN

## 2022-04-01 MED ORDER — ROCURONIUM BROMIDE 10 MG/ML (PF) SYRINGE
PREFILLED_SYRINGE | INTRAVENOUS | Status: AC
  Filled 2022-04-01: qty 10

## 2022-04-01 MED ORDER — SUGAMMADEX SODIUM 500 MG/5ML IV SOLN
INTRAVENOUS | Status: DC | PRN
  Administered 2022-04-01: 200 mg via INTRAVENOUS

## 2022-04-01 MED ORDER — LIDOCAINE HCL (CARDIAC) PF 100 MG/5ML IV SOSY
PREFILLED_SYRINGE | INTRAVENOUS | Status: DC | PRN
  Administered 2022-04-01: 100 mg via INTRAVENOUS

## 2022-04-01 MED ORDER — ROCURONIUM BROMIDE 100 MG/10ML IV SOLN
INTRAVENOUS | Status: DC | PRN
  Administered 2022-04-01 (×2): 30 mg via INTRAVENOUS
  Administered 2022-04-01: 70 mg via INTRAVENOUS

## 2022-04-01 MED ORDER — ONDANSETRON HCL 4 MG/2ML IJ SOLN
INTRAMUSCULAR | Status: AC
  Filled 2022-04-01: qty 2

## 2022-04-01 MED ORDER — GLYCOPYRROLATE 0.2 MG/ML IJ SOLN
INTRAMUSCULAR | Status: DC | PRN
  Administered 2022-04-01: .2 mg via INTRAVENOUS

## 2022-04-01 MED ORDER — OXYCODONE HCL 5 MG PO TABS: 5.0000 mg | ORAL_TABLET | ORAL | 0 refills | Status: AC | PRN

## 2022-04-01 MED ORDER — ONDANSETRON HCL 4 MG/2ML IJ SOLN
INTRAMUSCULAR | Status: DC | PRN
  Administered 2022-04-01 (×2): 4 mg via INTRAVENOUS

## 2022-04-01 MED ORDER — PHENYLEPHRINE 80 MCG/ML (10ML) SYRINGE FOR IV PUSH (FOR BLOOD PRESSURE SUPPORT)
PREFILLED_SYRINGE | INTRAVENOUS | Status: DC | PRN
  Administered 2022-04-01: 80 ug via INTRAVENOUS

## 2022-04-01 MED ORDER — CEFAZOLIN SODIUM-DEXTROSE 2-4 GM/100ML-% IV SOLN
INTRAVENOUS | Status: AC
  Filled 2022-04-01: qty 100

## 2022-04-01 MED ORDER — CHLORHEXIDINE GLUCONATE 0.12 % MT SOLN: 15.0000 mL | Freq: Once | OROMUCOSAL | Status: AC

## 2022-04-01 MED ORDER — LACTATED RINGERS IV SOLN: INTRAVENOUS | Status: DC

## 2022-04-01 MED ORDER — ASPIRIN 325 MG PO TBEC: 325.0000 mg | DELAYED_RELEASE_TABLET | Freq: Every day | ORAL | 0 refills | Status: AC

## 2022-04-01 MED ORDER — CHLORHEXIDINE GLUCONATE 0.12 % MT SOLN
OROMUCOSAL | Status: AC
  Administered 2022-04-01: 15 mL via OROMUCOSAL
  Filled 2022-04-01: qty 15

## 2022-04-01 MED ORDER — EPINEPHRINE PF 1 MG/ML IJ SOLN
INTRAMUSCULAR | Status: AC
  Filled 2022-04-01: qty 4

## 2022-04-01 MED ORDER — ONDANSETRON HCL 4 MG/2ML IJ SOLN: 4.0000 mg | Freq: Once | INTRAMUSCULAR | Status: DC | PRN

## 2022-04-01 MED ORDER — ESMOLOL HCL 100 MG/10ML IV SOLN
INTRAVENOUS | Status: DC | PRN
  Administered 2022-04-01: 10 mg via INTRAVENOUS

## 2022-04-01 MED ORDER — BUPIVACAINE HCL (PF) 0.5 % IJ SOLN
INTRAMUSCULAR | Status: DC | PRN
  Administered 2022-04-01: 17 mL via PERINEURAL

## 2022-04-01 MED ORDER — ACETAMINOPHEN 10 MG/ML IV SOLN
INTRAVENOUS | Status: AC
  Filled 2022-04-01: qty 100

## 2022-04-01 MED ORDER — CEFAZOLIN SODIUM-DEXTROSE 2-4 GM/100ML-% IV SOLN
2.0000 g | INTRAVENOUS | Status: AC
  Administered 2022-04-01: 2 g via INTRAVENOUS

## 2022-04-01 MED ORDER — MIDAZOLAM HCL 2 MG/2ML IJ SOLN
1.0000 mg | INTRAMUSCULAR | Status: DC | PRN
  Administered 2022-04-01: 2 mg via INTRAVENOUS

## 2022-04-01 MED ORDER — DEXAMETHASONE SODIUM PHOSPHATE 10 MG/ML IJ SOLN
INTRAMUSCULAR | Status: DC | PRN
  Administered 2022-04-01: 10 mg via INTRAVENOUS

## 2022-04-01 MED ORDER — MIDAZOLAM HCL 2 MG/2ML IJ SOLN
INTRAMUSCULAR | Status: AC
  Filled 2022-04-01: qty 2

## 2022-04-01 MED ORDER — SUCCINYLCHOLINE CHLORIDE 200 MG/10ML IV SOSY
PREFILLED_SYRINGE | INTRAVENOUS | Status: AC
  Filled 2022-04-01: qty 10

## 2022-04-01 MED ORDER — LIDOCAINE HCL (PF) 1 % IJ SOLN
INTRAMUSCULAR | Status: DC | PRN
  Administered 2022-04-01: 3 mL via SUBCUTANEOUS

## 2022-04-01 MED ORDER — LIDOCAINE HCL (PF) 2 % IJ SOLN
INTRAMUSCULAR | Status: AC
  Filled 2022-04-01: qty 5

## 2022-04-01 MED ORDER — ORAL CARE MOUTH RINSE: 15.0000 mL | Freq: Once | OROMUCOSAL | Status: AC

## 2022-04-01 MED ORDER — LACTATED RINGERS IV SOLN
INTRAVENOUS | Status: DC | PRN
  Administered 2022-04-01 (×2): 6000 mL

## 2022-04-01 MED ORDER — BUPIVACAINE LIPOSOME 1.3 % IJ SUSP
INTRAMUSCULAR | Status: DC | PRN
  Administered 2022-04-01: 8 mL via PERINEURAL

## 2022-04-01 MED ORDER — ONDANSETRON 4 MG PO TBDP: 4.0000 mg | ORAL_TABLET | Freq: Three times a day (TID) | ORAL | 0 refills | Status: DC | PRN

## 2022-04-01 MED ORDER — FENTANYL CITRATE (PF) 100 MCG/2ML IJ SOLN
INTRAMUSCULAR | Status: AC
  Filled 2022-04-01: qty 2

## 2022-04-01 MED ORDER — LIDOCAINE HCL (PF) 1 % IJ SOLN
INTRAMUSCULAR | Status: AC
  Filled 2022-04-01: qty 5

## 2022-04-01 MED ORDER — FENTANYL CITRATE (PF) 100 MCG/2ML IJ SOLN
INTRAMUSCULAR | Status: DC | PRN
  Administered 2022-04-01: 100 ug via INTRAVENOUS

## 2022-04-01 MED ORDER — ACETAMINOPHEN 500 MG PO TABS: 1000.0000 mg | ORAL_TABLET | Freq: Three times a day (TID) | ORAL | 2 refills | Status: AC

## 2022-04-01 MED ORDER — DEXAMETHASONE SODIUM PHOSPHATE 10 MG/ML IJ SOLN
INTRAMUSCULAR | Status: AC
  Filled 2022-04-01: qty 1

## 2022-04-01 MED ORDER — FAMOTIDINE 20 MG PO TABS
ORAL_TABLET | ORAL | Status: AC
  Administered 2022-04-01: 20 mg via ORAL
  Filled 2022-04-01: qty 1

## 2022-04-01 MED ORDER — RINGERS IRRIGATION IR SOLN
Status: DC | PRN
  Administered 2022-04-01: 18000 mL

## 2022-04-01 SURGICAL SUPPLY — 81 items
ADAPTER IRRIG TUBE 2 SPIKE SOL (ADAPTER) ×2 IMPLANT
ADH SKN CLS APL DERMABOND .7 (GAUZE/BANDAGES/DRESSINGS)
ADPR TBG 2 SPK PMP STRL ASCP (ADAPTER) ×2
ANCH SUT 1.4 SUT TPE BLK/WHT (SUTURE) ×2
ANCH SUT 2 SWLK 19.1 CLS EYLT (Anchor) ×2 IMPLANT
ANCH SUT 2X2.3 2 STRN TPE (Anchor) ×2 IMPLANT
ANCHOR ICONIX SPEED 2.0 TAPE (Anchor) IMPLANT
ANCHOR SWIVELOCK BIO 4.75X19.1 (Anchor) IMPLANT
APL PRP STRL LF DISP 70% ISPRP (MISCELLANEOUS) ×1
BLADE OSCILLATING/SAGITTAL (BLADE)
BLADE SHAVER 4.5X7 STR FR (MISCELLANEOUS) ×1 IMPLANT
BLADE SW THK.38XMED LNG THN (BLADE) IMPLANT
BNDG ADH 2 X3.75 FABRIC TAN LF (GAUZE/BANDAGES/DRESSINGS) ×1 IMPLANT
BNDG ADH XL 3.75X2 STRCH LF (GAUZE/BANDAGES/DRESSINGS) ×1
BUR BR 5.5 WIDE MOUTH (BURR) IMPLANT
CANNULA PART THRD DISP 5.75X7 (CANNULA) IMPLANT
CANNULA PARTIAL THREAD 2X7 (CANNULA) ×1 IMPLANT
CANNULA TWIST IN 8.25X7CM (CANNULA) IMPLANT
CANNULA TWIST IN 8.25X9CM (CANNULA) IMPLANT
CHLORAPREP W/TINT 26 (MISCELLANEOUS) ×1 IMPLANT
COOLER POLAR GLACIER W/PUMP (MISCELLANEOUS) ×1 IMPLANT
COVER LIGHT HANDLE STERIS (MISCELLANEOUS) ×1 IMPLANT
DERMABOND ADVANCED .7 DNX12 (GAUZE/BANDAGES/DRESSINGS) IMPLANT
DEVICE SUCT BLK HOLE OR FLOOR (MISCELLANEOUS) IMPLANT
DRAPE INCISE IOBAN 66X45 STRL (DRAPES) ×1 IMPLANT
DRAPE U-SHAPE 47X51 STRL (DRAPES) ×2 IMPLANT
ELECT REM PT RETURN 9FT ADLT (ELECTROSURGICAL) ×1
ELECTRODE REM PT RTRN 9FT ADLT (ELECTROSURGICAL) ×1 IMPLANT
GAUZE SPONGE 4X4 12PLY STRL (GAUZE/BANDAGES/DRESSINGS) ×1 IMPLANT
GAUZE XEROFORM 1X8 LF (GAUZE/BANDAGES/DRESSINGS) ×1 IMPLANT
GLOVE BIO SURGEON STRL SZ7.5 (GLOVE) ×2 IMPLANT
GLOVE BIOGEL PI IND STRL 8 (GLOVE) ×2 IMPLANT
GLOVE SURG SYN 8.0 (GLOVE) ×3 IMPLANT
GLOVE SURG SYN 8.0 PF PI (GLOVE) ×2 IMPLANT
GOWN STRL REUS W/ TWL LRG LVL3 (GOWN DISPOSABLE) ×2 IMPLANT
GOWN STRL REUS W/TWL LRG LVL3 (GOWN DISPOSABLE) ×2
GOWN STRL REUS W/TWL XL LVL4 (GOWN DISPOSABLE) ×1 IMPLANT
IV LACTATED RINGER IRRG 3000ML (IV SOLUTION) ×11
IV LR IRRIG 3000ML ARTHROMATIC (IV SOLUTION) ×4 IMPLANT
KIT STABILIZATION SHOULDER (MISCELLANEOUS) ×1 IMPLANT
KIT TURNOVER KIT A (KITS) ×1 IMPLANT
MANIFOLD NEPTUNE II (INSTRUMENTS) ×2 IMPLANT
MASK FACE SPIDER DISP (MASK) ×1 IMPLANT
MAT ABSORB  FLUID 56X50 GRAY (MISCELLANEOUS) ×2
MAT ABSORB FLUID 56X50 GRAY (MISCELLANEOUS) ×2 IMPLANT
NDL MAYO 6 CRC TAPER PT (NEEDLE) IMPLANT
NDL SCORPION MULTI FIRE (NEEDLE) IMPLANT
NEEDLE HYPO 22GX1.5 SAFETY (NEEDLE) ×1 IMPLANT
NEEDLE MAYO 6 CRC TAPER PT (NEEDLE) IMPLANT
NEEDLE SCORPION MULTI FIRE (NEEDLE) IMPLANT
PACK ARTHROSCOPY SHOULDER (MISCELLANEOUS) ×1 IMPLANT
PAD ABD DERMACEA PRESS 5X9 (GAUZE/BANDAGES/DRESSINGS) ×1 IMPLANT
PAD ARMBOARD 7.5X6 YLW CONV (MISCELLANEOUS) ×2 IMPLANT
PAD WRAPON POLAR SHDR XLG (MISCELLANEOUS) ×1 IMPLANT
PASSER SUT FIRSTPASS SELF (INSTRUMENTS) ×1 IMPLANT
SHAVER BLADE BONE CUTTER  5.5 (BLADE)
SHAVER BLADE BONE CUTTER 5.5 (BLADE) IMPLANT
SLEEVE REMOTE CONTROL 5X12 (DRAPES) IMPLANT
SLING ULTRA II M (MISCELLANEOUS) ×1 IMPLANT
SPONGE T-LAP 18X18 ~~LOC~~+RFID (SPONGE) ×1 IMPLANT
STRAP SAFETY 5IN WIDE (MISCELLANEOUS) ×1 IMPLANT
SUT ETHILON 3-0 (SUTURE) ×1 IMPLANT
SUT LASSO 90 DEG SD STR (SUTURE) IMPLANT
SUT MNCRL 4-0 (SUTURE)
SUT MNCRL 4-0 27XMFL (SUTURE)
SUT PROLENE 0 CT 2 (SUTURE) IMPLANT
SUT TICRON 2-0 30IN 311381 (SUTURE) IMPLANT
SUT VIC AB 0 CT1 36 (SUTURE) IMPLANT
SUT VIC AB 2-0 CT2 27 (SUTURE) IMPLANT
SUT XBRAID 1.4 BLK/WHT (SUTURE) IMPLANT
SUTURE MNCRL 4-0 27XMF (SUTURE) IMPLANT
SUTURE TAPE 1.3 40 TPR END (SUTURE) IMPLANT
SUTURETAPE 1.3 40 TPR END (SUTURE)
SYR 10ML LL (SYRINGE) IMPLANT
TAPE MICROFOAM 4IN (TAPE) ×1 IMPLANT
TRAP FLUID SMOKE EVACUATOR (MISCELLANEOUS) ×1 IMPLANT
TUBING INFLOW SET DBFLO PUMP (TUBING) ×1 IMPLANT
TUBING OUTFLOW SET DBLFO PUMP (TUBING) ×1 IMPLANT
WAND WEREWOLF FLOW 90D (MISCELLANEOUS) ×1 IMPLANT
WATER STERILE IRR 500ML POUR (IV SOLUTION) ×1 IMPLANT
WRAPON POLAR PAD SHDR XLG (MISCELLANEOUS) ×1

## 2022-04-01 NOTE — Op Note (Signed)
SURGERY DATE: 04/01/2022   PRE-OP DIAGNOSIS:  1. Left high-grade articular sided rotator cuff tear  POST-OP DIAGNOSIS: 1. Left high-grade articular sided rotator cuff tear  PROCEDURES:  1. Left arthroscopic rotator cuff repair 2. Left arthroscopic extensive debridement of shoulder (glenohumeral and subacromial spaces)  SURGEON: Rosealee Albee, MD   ASSISTANT: Sonny Dandy, PA   ANESTHESIA: Gen with Exparel interscalene block   ESTIMATED BLOOD LOSS: 5cc   DRAINS:  none   TOTAL IV FLUIDS: per anesthesia      SPECIMENS: none   IMPLANTS:   - Arthrex 4.64mm SwiveLock x 2 - Iconix SPEED double loaded with 1.2 and 2.64mm tape x 2     OPERATIVE FINDINGS:  Examination under anesthesia: A careful examination under anesthesia was performed.  Passive range of motion was: FF: 160; ER at side: 60; ER in abduction: 95; IR in abduction: 45.  Anterior load shift: NT.  Posterior load shift: NT.  Sulcus in neutral: NT.  Sulcus in ER: NT.     Intra-operative findings: A thorough arthroscopic examination of the shoulder was performed.  The findings are: 1. Biceps tendon: Not visualized in the joint due to prior tenodesis 2. Superior labrum: erythema and degenerative 3. Posterior labrum and capsule: Degenerative labrum 4. Inferior capsule and inferior recess: normal 5. Glenoid cartilage surface: Normal 6. Supraspinatus attachment: High-grade articular sided tear spanning the supraspinatus 7. Posterior rotator cuff attachment: normal 8. Humeral head articular cartilage: normal with scattered grade 1 degenerative changes 9. Rotator interval: significant synovitis 10: Subscapularis tendon: attachment intact 11. Anterior labrum: degenerative 12. IGHL: normal   OPERATIVE REPORT:    Indications for procedure:  Todd Hancock is a 51 y.o. male who had undergone left shoulder arthroscopic Rotator cuff repair using Regeneten patch, arthroscopic biceps tenodesis, distal clavicle excision for  osteolysis, and subacromial decompression by me on 08/29/2021.  He did get significant improvement from the surgery, but still had some pain with cross body maneuvers, overhead activity, and pain with sleeping.  His symptoms were not responsive to nonoperative management after surgery including extensive physical therapy, corticosteroid injections, and medical management.  Clinical exam and MRI were suggestive of high-grade partial-thickness rotator cuff tear. After discussion of risks, benefits, and alternatives to surgery, the patient elected to proceed.    Procedure in detail:   I identified Todd Hancock in the pre-operative holding area.  I marked the operative shoulder with my initials. I reviewed the risks and benefits of the proposed surgical intervention, and the patient wished to proceed.  Anesthesia was then performed with an Exparel interscalene block.  The patient was transferred to the operative suite and placed in the beach chair position.     Appropriate IV antibiotics were administered prior to incision. The operative upper extremity was then prepped and draped in standard fashion. A time out was performed confirming the correct extremity, correct patient, and correct procedure.    I then created a standard posterior portal with an 11 blade. The glenohumeral joint was easily entered with a blunt trocar and the arthroscope introduced. The findings of diagnostic arthroscopy are described above. I debrided degenerative tissue including the synovitic tissue about the rotator interval and anterior, posterior, and superior labrum. I then coagulated the inflamed synovium to obtain hemostasis and reduce the risk of post-operative swelling using an Arthrocare radiofrequency device.  The region of high-grade articular sided tearing was marked with an 0 PDS suture passed through the subacromial space for later identification.  Next,  the arthroscope was then introduced into the subacromial space. A  direct lateral portal was created with an 11-blade after spinal needle localization. An extensive subacromial bursectomy and debridement was performed using a combination of the shaver and Arthrocare wand. The entire acromial undersurface was exposed.  The prior Regeneten patch had integrated well into the rotator cuff.  However in the region of the past 0 PDS suture, there was significant thinning and a switching stick could easily be passed into the glenohumeral joint.  Rotator cuff fibers around this area were debrided with an oscillating shaver and ArthroCare wand.    Next, I created an accessory posterolateral portal to assist with visualization and instrumentation.  I debrided the poor quality edges of the supraspinatus tendon.  This was a U-shaped tear of the supraspinatus with the posterior leaflet more mobile.  I prepared the footprint using a burr to expose bleeding bone.     I then percutaneously placed 1 Iconix SPEED medial row anchor along the anterior portion of the tear at the articular margin. Another SPEED anchor was placed along the posterior portion of the tear at the articular margin. I then shuttled all 8 strands of tape through the rotator cuff just lateral to the musculotendinous junction using a FirstPass suture passer spanning the anterior to posterior extent of the tear.  Next, 2 margin convergence sutures were placed in a side-to-side fashion and tied arthroscopically.  This reduced the rotator cuff tear quite nicely.  The posterior strands of each suture were passed through an [employer] anchor.  This was placed approximately 2 cm distal to the lateral edge of the footprint in line with the posterior aspect of the tear with appropriate tensioning of each suture prior to final fixation.  Similarly, the anterior strands of each suture were passed through another SwiveLock anchor along the anterior margin of the tear.  This construct allowed for excellent reapproximation of the  rotator cuff to its native footprint without undue tension.  Appropriate compression was achieved.  The repair was stable to external and internal rotation.   Fluid was evacuated from the shoulder, and the portals were closed with 3-0 Nylon. Xeroform was applied to the portals. A sterile dressing was applied, followed by a Polar Care sleeve and a SlingShot shoulder immobilizer/sling. The patient was awakened from anesthesia without difficulty and was transferred to the PACU in stable condition.    Of note, assistance from a PA was essential to performing the surgery.  PA was present for the entire surgery.  PA assisted with patient positioning, retraction, instrumentation, and wound closure. The surgery would have been more difficult and had longer operative time without PA assistance.   COMPLICATIONS: none   DISPOSITION: plan for discharge home after recovery in PACU     POSTOPERATIVE PLAN: Remain in sling (except hygiene and elbow/wrist/hand RoM exercises as instructed by PT) x 6 weeks and NWB for this time. PT to begin 3-4 days after surgery.  Large rotator cuff repair rehab protocol. ASA 325mg  daily x 2 weeks for DVT ppx.

## 2022-04-01 NOTE — Transfer of Care (Signed)
Immediate Anesthesia Transfer of Care Note  Patient: Todd Hancock  Procedure(s) Performed: Left revision arthroscopic rotator cuff repair vs PASTA repair) and glenohumeral debridement (Left)  Patient Location: PACU  Anesthesia Type:General  Level of Consciousness: awake, drowsy, and patient cooperative  Airway & Oxygen Therapy: Patient Spontanous Breathing and Patient connected to face mask oxygen  Post-op Assessment: Report given to RN and Post -op Vital signs reviewed and stable  Post vital signs: Reviewed and stable  Last Vitals:  Vitals Value Taken Time  BP 125/80 04/01/22 1754  Temp    Pulse 65 04/01/22 1758  Resp 16 04/01/22 1753  SpO2 100 % 04/01/22 1758  Vitals shown include unvalidated device data.  Last Pain:  Vitals:   04/01/22 1356  TempSrc: Temporal  PainSc: 2          Complications: No notable events documented.

## 2022-04-01 NOTE — Discharge Instructions (Addendum)
Post-Op Instructions - Rotator Cuff Repair  1. Bracing: You will wear a shoulder immobilizer or sling for 6 weeks.   2. Driving: No driving for 3 weeks post-op. When driving, do not wear the immobilizer. Ideally, we recommend no driving for 6 weeks while sling is in place as one arm will be immobilized.   3. Activity: No active lifting for 2 months. Wrist, hand, and elbow motion only. Avoid lifting the upper arm away from the body except for hygiene. You are permitted to bend and straighten the elbow passively only (no active elbow motion). You may use your hand and wrist for typing, writing, and managing utensils (cutting food). Do not lift more than a coffee cup for 8 weeks.  When sleeping or resting, inclined positions (recliner chair or wedge pillow) and a pillow under the forearm for support may provide better comfort for up to 4 weeks.  Avoid long distance travel for 4 weeks.  Return to normal activities after rotator cuff repair repair normally takes 6 months on average. If rehab goes very well, may be able to do most activities at 4 months, except overhead or contact sports.  4. Physical Therapy: Begins 3-4 days after surgery, and proceed 1 time per week for the first 6 weeks, then 1-2 times per week from weeks 6-20 post-op.  5. Medications:  - You will be provided a prescription for narcotic pain medicine. After surgery, take 1-2 narcotic tablets every 4 hours if needed for severe pain.  - A prescription for anti-nausea medication will be provided in case the narcotic medicine causes nausea - take 1 tablet every 6 hours only if nauseated.   - Take tylenol 1000 mg (2 Extra Strength tablets or 3 regular strength) every 8 hours for pain.  May decrease or stop tylenol 5 days after surgery if you are having minimal pain. - Take ASA 325mg/day x 2 weeks to help prevent DVTs/PEs (blood clots).  - DO NOT take ANY nonsteroidal anti-inflammatory pain medications (Advil, Motrin, Ibuprofen, Aleve,  Naproxen, or Naprosyn). These medicines can inhibit healing of your shoulder repair.    If you are taking prescription medication for anxiety, depression, insomnia, muscle spasm, chronic pain, or for attention deficit disorder, you are advised that you are at a higher risk of adverse effects with use of narcotics post-op, including narcotic addiction/dependence, depressed breathing, death. If you use non-prescribed substances: alcohol, marijuana, cocaine, heroin, methamphetamines, etc., you are at a higher risk of adverse effects with use of narcotics post-op, including narcotic addiction/dependence, depressed breathing, death. You are advised that taking > 50 morphine milligram equivalents (MME) of narcotic pain medication per day results in twice the risk of overdose or death. For your prescription provided: oxycodone 5 mg - taking more than 6 tablets per day would result in > 50 morphine milligram equivalents (MME) of narcotic pain medication. Be advised that we will prescribe narcotics short-term, for acute post-operative pain only - 3 weeks for major operations such as shoulder repair/reconstruction surgeries.     6. Post-Op Appointment:  Your first post-op appointment will be 10-14 days post-op.  7. Work or School: For most, but not all procedures, we advise staying out of work or school for at least 1 to 2 weeks in order to recover from the stress of surgery and to allow time for healing.   If you need a work or school note this can be provided.   8. Smoking: If you are a smoker, you need to refrain from   smoking in the postoperative period. The nicotine in cigarettes will inhibit healing of your shoulder repair and decrease the chance of successful repair. Similarly, nicotine containing products (gum, patches) should be avoided.   Post-operative Brace: Apply and remove the brace you received as you were instructed to at the time of fitting and as described in detail as the brace's  instructions for use indicate.  Wear the brace for the period of time prescribed by your physician.  The brace can be cleaned with soap and water and allowed to air dry only.  Should the brace result in increased pain, decreased feeling (numbness/tingling), increased swelling or an overall worsening of your medical condition, please contact your doctor immediately.  If an emergency situation occurs as a result of wearing the brace after normal business hours, please dial 911 and seek immediate medical attention.  Let your doctor know if you have any further questions about the brace issued to you. Refer to the shoulder sling instructions for use if you have any questions regarding the correct fit of your shoulder sling.  [facility] Customer Care for Troubleshooting: [phone]  Video that illustrates how to properly use a shoulder sling: "Instructions for Proper Use of an Orthopaedic Sling" http://bass.com/  AMBULATORY SURGERY  DISCHARGE INSTRUCTIONS   The drugs that you were given will stay in your system until tomorrow so for the next 24 hours you should not:  Drive an automobile Make any legal decisions Drink any alcoholic beverage   You may resume regular meals tomorrow.  Today it is better to start with liquids and gradually work up to solid foods.  You may eat anything you prefer, but it is better to start with liquids, then soup and crackers, and gradually work up to solid foods.   Please notify your doctor immediately if you have any unusual bleeding, trouble breathing, redness and pain at the surgery site, drainage, fever, or pain not relieved by medication.    Additional Instructions:  PLEASE LEAVE GREEN ARMBAND ON FOR 4 DAYS    Please contact your physician with any problems or Same Day Surgery at [phone], Monday through Friday 6 am to 4 pm, or [place] at [facility] number at [phone].  POLAR CARE INFORMATION  MassAdvertisement.it  How  to use [employer] Polar Care [facility] Therapy System?  YouTube   ShippingScam.co.uk  OPERATING INSTRUCTIONS  Start the product With dry hands, connect the transformer to the electrical connection located on the top of the cooler. Next, plug the transformer into an appropriate electrical outlet. The unit will automatically start running at this point.  To stop the pump, disconnect electrical power.  Unplug to stop the product when not in use. Unplugging the Polar Care unit turns it off. Always unplug immediately after use. Never leave it plugged in while unattended. Remove pad.    FIRST ADD WATER TO FILL LINE, THEN ICE---Replace ice when existing ice is almost melted  1 Discuss Treatment with your Licensed Health Care Practitioner and Use Only as Prescribed 2 Apply Insulation Barrier & Cold Therapy Pad 3 Check for Moisture 4 Inspect Skin Regularly  Tips and Trouble Shooting Usage Tips 1. Use cubed or chunked ice for optimal performance. 2. It is recommended to drain the Pad between uses. To drain the pad, hold the Pad upright with the hose pointed toward the ground. Depress the black plunger and allow water to drain out. 3. You may disconnect the Pad from the unit without removing the pad from the  affected area by depressing the silver tabs on the hose coupling and gently pulling the hoses apart. The Pad and unit will seal itself and will not leak. Note: Some dripping during release is normal. 4. DO NOT RUN PUMP WITHOUT WATER! The pump in this unit is designed to run with water. Running the unit without water will cause permanent damage to the pump. 5. Unplug unit before removing lid.  TROUBLESHOOTING GUIDE Pump not running, Water not flowing to the pad, Pad is not getting cold 1. Make sure the transformer is plugged into the wall outlet. 2. Confirm that the ice and water are filled to the indicated levels. 3. Make sure there are no kinks in the pad. 4. Gently pull  on the blue tube to make sure the tube/pad junction is straight. 5. Remove the pad from the treatment site and ll it while the pad is lying at; then reapply. 6. Confirm that the pad couplings are securely attached to the unit. Listen for the double clicks (Figure 1) to confirm the pad couplings are securely attached.  Leaks    Note: Some condensation on the lines, controller, and pads is unavoidable, especially in warmer climates. 1. If using a [employer] Polar Care Cold Therapy unit with a detachable Cold Therapy Pad, and a leak exists (other than condensation on the lines) disconnect the pad couplings. Make sure the silver tabs on the couplings are depressed before reconnecting the pad to the pump hose; then confirm both sides of the coupling are properly clicked in. 2. If the coupling continues to leak or a leak is detected in the pad itself, stop using it and call [employer] Customer Care at [phone].  Cleaning After use, empty and dry the unit with a soft cloth. Warm water and mild detergent may be used occasionally to clean the pump and tubes.  WARNING: The Polar Care Cube can be cold enough to cause serious injury, including full skin necrosis. Follow these Operating Instructions, and carefully read the Product Insert (see pouch on side of unit) and the Cold Therapy Pad Fitting Instructions (provided with each Cold Therapy Pad) prior to use.   SHOULDER SLING IMMOBILIZER   VIDEO Slingshot 2 Shoulder Brace Application - YouTube ---https://www.porter.info/  INSTRUCTIONS While supporting the injured arm, slide the forearm into the sling. Wrap the adjustable shoulder strap around the neck and shoulders and attach the strap end to the sling using  the "alligator strap tab."  Adjust the shoulder strap to the required length. Position the shoulder pad behind the neck. To secure the shoulder pad location (optional), pull the shoulder strap away from the shoulder pad, unfold the hook  material on the top of the pad, then press the shoulder strap back onto the hook material to secure the pad in place. Attach the closure strap across the open top of the sling. Position the strap so that it holds the arm securely in the sling. Next, attach the thumb strap to the open end of the sling between the thumb and fingers. After sling has been fit, it may be easily removed and reapplied using the quick release buckle on shoulder strap. If a neutral pillow or 15 abduction pillow is included, place the pillow at the waistline. Attach the sling to the pillow, lining up hook material on the pillow with the loop on sling. Adjust the waist strap to fit.  If waist strap is too long, cut it to fit. Use the small piece of double sided hook  material (located on top of the pillow) to secure the strap end. Place the double sided hook material on the inside of the cut strap end and secure it to the waist strap.     If no pillow is included, attach the waist strap to the sling and adjust to fit.    Washing Instructions: Straps and sling must be removed and cleaned regularly depending on your activity level and perspiration. Hand wash straps and sling in cold water with mild detergent, rinse, air dry        Interscalene Nerve Block with Exparel   For your surgery you have received an Interscalene Nerve Block with Exparel. Nerve Blocks affect many types of nerves, including nerves that control movement, pain and normal sensation.  You may experience feelings such as numbness, tingling, heaviness, weakness or the inability to move your arm or the feeling or sensation that your arm has "fallen asleep". A nerve block with Exparel can last up to 5 days.  Usually the weakness wears off first.  The tingling and heaviness usually wear off next.  Finally you may start to notice pain.  Keep in mind that this may occur in any order.  Once a nerve block starts to wear off it is usually completely gone within 60  minutes. ISNB may cause mild shortness of breath, a hoarse voice, blurry vision, unequal pupils, or drooping of the face on the same side as the nerve block.  These symptoms will usually resolve with the numbness.  Very rarely the procedure itself can cause mild seizures. If needed, your surgeon will give you a prescription for pain medication.  It will take about 60 minutes for the oral pain medication to become fully effective.  So, it is recommended that you start taking this medication before the nerve block first begins to wear off, or when you first begin to feel discomfort. Take your pain medication only as prescribed.  Pain medication can cause sedation and decrease your breathing if you take more than you need for the level of pain that you have. Nausea is a common side effect of many pain medications.  You may want to eat something before taking your pain medicine to prevent nausea. After an Interscalene nerve block, you cannot feel pain, pressure or extremes in temperature in the effected arm.  Because your arm is numb it is at an increased risk for injury.  To decrease the possibility of injury, please practice the following:  While you are awake change the position of your arm frequently to prevent too much pressure on any one area for prolonged periods of time.  If you have a cast or tight dressing, check the color or your fingers every couple of hours.  Call your surgeon with the appearance of any discoloration (white or blue). If you are given a sling to wear before you go home, please wear it  at all times until the block has completely worn off.  Do not get up at night without your sling. Please contact [facility] Anesthesia or your surgeon if you do not begin to regain sensation after 7 days from the surgery.  Anesthesia may be contacted by calling the Same Day Surgery Department, Mon. through Fri., 6 am to 4 pm at [phone].   If you experience any other problems or concerns, please  contact your surgeon's office. If you experience severe or prolonged shortness of breath go to the nearest emergency department.

## 2022-04-01 NOTE — H&P (Signed)
Paper H&P to be scanned into permanent record. H&P reviewed. No significant changes noted.  

## 2022-04-01 NOTE — Anesthesia Preprocedure Evaluation (Signed)
Anesthesia Evaluation  Patient identified by MRN, date of birth, ID band Patient awake    Reviewed: Allergy & Precautions, H&P , NPO status , Patient's Chart, lab work & pertinent test results, reviewed documented beta blocker date and time   Airway Mallampati: II  TM Distance: >3 FB Neck ROM: full    Dental  (+) Teeth Intact   Pulmonary neg pulmonary ROS, Current Smoker and Patient abstained from smoking.   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, right     Comment:  90 % No date: History of kidney stones No date: Medical history non-contributory No date: Tendinosis of left rotator cuff No date: Traumatic incomplete tear of left rotator cuff Past Surgical History: No date: KNEE ARTHROSCOPY; Right No date: SHOULDER OPEN ROTATOR CUFF REPAIR; Left 03/31/2016: TENNIS ELBOW RELEASE/NIRSCHEL PROCEDURE; Right     Comment:  Procedure: TENNIS ELBOW RELEASE/NIRSCHEL PROCEDURE;                Surgeon: Deeann Saint, MD;  Location: [facility] ORS;                Service: Orthopedics;  Laterality: Right;   Reproductive/Obstetrics negative OB ROS                             Anesthesia Physical Anesthesia Plan  ASA: 2  Anesthesia Plan: General ETT   Post-op Pain Management: Regional block*   Induction:   PONV Risk Score and Plan:   Airway Management Planned:   Additional Equipment:   Intra-op Plan:   Post-operative Plan:    Informed Consent: I have reviewed the patients History and Physical, chart, labs and discussed the procedure including the risks, benefits and alternatives for the proposed anesthesia with the patient or authorized representative who has indicated his/her understanding and acceptance.     Dental Advisory Given  Plan Discussed with: CRNA  Anesthesia Plan Comments: (I spoke with the patient regarding risks and benefits of post op pain block.  The procedure is per surgeon request and the patient voices understanding of the nature of the injection.  They understand the procedure is to assist with post op pain control and not to eliminate pain. Furthermore, they accept that the procedure has potential risks, including but not limited to: failed block, IV injection, paresthesia or nerve related injury etc. They desire to proceed with the above injection as discussed.  Dr. Pernell Dupre)       Anesthesia Quick Evaluation

## 2022-04-01 NOTE — Anesthesia Postprocedure Evaluation (Signed)
Anesthesia Post Note  Patient: Todd Hancock  Procedure(s) Performed: Left revision arthroscopic rotator cuff repair vs PASTA repair) and glenohumeral debridement (Left)  Patient location during evaluation: PACU Anesthesia Type: General Level of consciousness: awake and alert Pain management: pain level controlled Vital Signs Assessment: post-procedure vital signs reviewed and stable Respiratory status: spontaneous breathing, nonlabored ventilation, respiratory function stable and patient connected to nasal cannula oxygen Cardiovascular status: blood pressure returned to baseline and stable Postop Assessment: no apparent nausea or vomiting Anesthetic complications: no   No notable events documented.   Last Vitals:  Vitals:   04/01/22 1815 04/01/22 1851  BP: 131/87 119/73  Pulse: 65 74  Resp: 18 18  Temp:  36.9 C  SpO2: 100% 98%    Last Pain:  Vitals:   04/01/22 1851  TempSrc: Temporal  PainSc: 0-No pain                 Cleda Mccreedy Giovoni Bunch

## 2022-04-01 NOTE — Anesthesia Procedure Notes (Signed)
Anesthesia Regional Block: Interscalene brachial plexus block   Pre-Anesthetic Checklist: , timeout performed,  Correct Patient, Correct Site, Correct Laterality,  Correct Procedure, Correct Position, site marked,  Risks and benefits discussed,  Surgical consent,  Pre-op evaluation,  At surgeon's request and post-op pain management  Laterality: Upper and Left  Prep: chloraprep       Needles:  Injection technique: Single-shot  Needle Type: Echogenic Stimulator Needle     Needle Length: 10cm  Needle Gauge: 20     Additional Needles:   Procedures:,,,, ultrasound used (permanent image in chart),,    Narrative:  End time: 04/01/2022 2:27 PM  Performed by: Personally  Anesthesiologist: Yevette Edwards, MD  Additional Notes: Pt. Identified and accepting of procedure after risks and benefits fully reviewed and questions answered. Time out performed and laterality confirmed prior to procedure.  ISNB  performed without difficulty and well tolerated.  Neg IV and SATD.  No pain on injection of Local anesthetic and VSST.

## 2022-04-01 NOTE — Anesthesia Procedure Notes (Signed)
Procedure Name: Intubation Date/Time: 04/01/2022 2:43 PM  Performed by: Mohammed Kindle, CRNAPre-anesthesia Checklist: Patient identified, Emergency Drugs available, Suction available and Patient being monitored Patient Re-evaluated:Patient Re-evaluated prior to induction Oxygen Delivery Method: Circle system utilized Preoxygenation: Pre-oxygenation with 100% oxygen Induction Type: IV induction Ventilation: Mask ventilation without difficulty Laryngoscope Size: McGraph and 3 Grade View: Grade I Tube type: Oral Tube size: 7.0 mm Number of attempts: 1 Airway Equipment and Method: Stylet and Oral airway Placement Confirmation: ETT inserted through vocal cords under direct vision, positive ETCO2, breath sounds checked- equal and bilateral and CO2 detector Secured at: 21 cm Tube secured with: Tape Dental Injury: Teeth and Oropharynx as per pre-operative assessment

## 2022-04-02 ENCOUNTER — Encounter: Payer: Self-pay | Admitting: Orthopedic Surgery

## 2022-04-22 ENCOUNTER — Encounter: Payer: Self-pay | Admitting: Orthopedic Surgery

## 2023-04-15 DIAGNOSIS — K429 Umbilical hernia without obstruction or gangrene: Secondary | ICD-10-CM

## 2023-04-15 DIAGNOSIS — K4090 Unilateral inguinal hernia, without obstruction or gangrene, not specified as recurrent: Secondary | ICD-10-CM

## 2023-04-15 HISTORY — DX: Unilateral inguinal hernia, without obstruction or gangrene, not specified as recurrent: K40.90

## 2023-04-15 HISTORY — DX: Umbilical hernia without obstruction or gangrene: K42.9

## 2023-06-12 ENCOUNTER — Ambulatory Visit
Admission: EM | Admit: 2023-06-12 | Discharge: 2023-06-12 | Disposition: A | Discharge status: Home / Self Care | Payer: BC Managed Care – PPO | Attending: Physician Assistant | Admitting: Physician Assistant

## 2023-06-12 ENCOUNTER — Encounter: Payer: Self-pay | Admitting: Emergency Medicine

## 2023-06-12 DIAGNOSIS — S76211A Strain of adductor muscle, fascia and tendon of right thigh, initial encounter: Secondary | ICD-10-CM

## 2023-06-12 DIAGNOSIS — K4090 Unilateral inguinal hernia, without obstruction or gangrene, not specified as recurrent: Secondary | ICD-10-CM | POA: Diagnosis not present

## 2023-06-12 MED ORDER — NAPROXEN 500 MG PO TABS: 500.0000 mg | ORAL_TABLET | Freq: Two times a day (BID) | ORAL | 0 refills | Status: DC | PRN

## 2023-06-12 MED ORDER — KETOROLAC TROMETHAMINE 60 MG/2ML IM SOLN
30.0000 mg | Freq: Once | INTRAMUSCULAR | Status: AC
  Administered 2023-06-12: 30 mg via INTRAMUSCULAR

## 2023-06-12 NOTE — Discharge Instructions (Addendum)
-  You have pulled a muscle in your groin.  Ice the area to help with swelling.  May use heat to help with muscle spasms.  You can also take the muscle relaxer you have at home. - We gave you an injection of anti-inflammatory medicine in the clinic and I sent an anti-inflammatory medication to the pharmacy.  You can also take Tylenol.  Avoid painful activities. - You do appear to have a hernia so keep your appointment with your primary care provider on Monday.  I expect they will probably obtain ultrasound of the area.  You may also discuss further the groin strain.  Sometimes that area is ultrasound as well to see if something is significantly torn.  If so we will need to follow-up with orthopedics.

## 2023-06-12 NOTE — ED Triage Notes (Signed)
 Patient states that he was golfing on Tuesday land felt a pull in his right groin.  Patient reports ongoing in his right groin.

## 2023-06-12 NOTE — ED Provider Notes (Signed)
 [facility]    CSN: [id number] Arrival date & time: 06/12/23  1125      History   Chief Complaint Chief Complaint  Patient presents with   Groin Pain    right    HPI Todd Hancock is a 53 y.o. male presenting for right groin pain x 3 days. He says he felt a sharp pulling pain when he was golfing. Reports increased pain with walking and weightbearing. Increased pain with crossing his leg. Area is a little swollen. Also reports suspected hernia of the left groin for the past few months. Has an appointment with PCP about possible hernia on Monday.  Currently not feeling any pain or swelling in testicle.  Has not taken any medication for pain relief other than Tylenol.  No other concerns.  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   Past Surgical History:  Procedure Laterality Date   KNEE ARTHROSCOPY Right    SHOULDER ARTHROSCOPY WITH OPEN ROTATOR CUFF REPAIR Left 04/01/2022   Procedure: Left revision arthroscopic rotator cuff repair vs PASTA repair) and glenohumeral debridement;  Surgeon: Signa Kell, MD;  Location: [facility];  Service: Orthopedics;  Laterality: Left;   SHOULDER OPEN ROTATOR CUFF REPAIR Left    TENNIS ELBOW RELEASE/NIRSCHEL PROCEDURE Right 03/31/2016   Procedure: TENNIS ELBOW RELEASE/NIRSCHEL PROCEDURE;  Surgeon: Deeann Saint, MD;  Location: [facility];  Service: Orthopedics;  Laterality: Right;       Home Medications    Prior to Admission medications   Medication Sig Start Date End Date Taking? Authorizing Provider  naproxen (NAPROSYN) 500 MG tablet Take 1 tablet (500 mg total) by mouth 2 (two) times daily as needed for moderate pain (pain score 4-6). 06/12/23  Yes Eusebio Friendly B, PA-C   gabapentin (NEURONTIN) 400 MG capsule Take 1 capsule (400 mg total) by mouth 2 (two) times daily. 03/31/16 10/13/18  Deeann Saint, MD    Family History Family History  Problem Relation Age of Onset   Cancer Father     Social History Social History   Tobacco Use   Smoking status: Some Days    Types: Cigarettes   Smokeless tobacco: Never   Tobacco comments:    PT SMOKES 1 PACK/WEEKLY-ONLY SMOKES WHEN HE DRINKS  Vaping Use   Vaping status: Never Used  Substance Use Topics   Alcohol use: Not Currently    Comment: OCC   Drug use: No     Allergies   Patient has no known allergies.   Review of Systems Review of Systems  Gastrointestinal:  Negative for abdominal pain.  Genitourinary:  Negative for difficulty urinating, dysuria, penile discharge, penile pain, penile swelling, scrotal swelling and testicular pain.  Musculoskeletal:  Positive for myalgias (groin pain, left). Negative for arthralgias, back pain, gait problem and joint swelling.  Skin:  Negative for color change, rash and wound.  Neurological:  Negative for weakness.     Physical Exam Triage Vital Signs ED Triage Vitals  Encounter Vitals Group     BP 06/12/23 1158 (!) 124/97     Systolic BP Percentile --      Diastolic BP Percentile --  Pulse Rate 06/12/23 1158 79     Resp 06/12/23 1158 16     Temp 06/12/23 1158 98.2 F (36.8 C)     Temp Source 06/12/23 1158 Oral     SpO2 06/12/23 1158 97 %     Weight 06/12/23 1157 185 lb (83.9 kg)     Height 06/12/23 1157 6' (1.829 m)     Head Circumference --      Peak Flow --      Pain Score 06/12/23 1157 6     Pain Loc --      Pain Education --      Exclude from Growth Chart --    No data found.  Updated Vital Signs BP (!) 124/97 (BP Location: Right Arm)   Pulse 79   Temp 98.2 F (36.8 C) (Oral)   Resp 16   Ht 6' (1.829 m)   Wt 185 lb (83.9 kg)   SpO2 97%   BMI 25.09 kg/m      Physical Exam Vitals and nursing note reviewed.   Constitutional:      General: He is not in acute distress.    Appearance: Normal appearance. He is well-developed. He is not ill-appearing.  HENT:     Head: Normocephalic and atraumatic.  Eyes:     General: No scleral icterus.    Conjunctiva/sclera: Conjunctivae normal.  Cardiovascular:     Rate and Rhythm: Normal rate and regular rhythm.     Heart sounds: Normal heart sounds.  Pulmonary:     Effort: Pulmonary effort is normal. No respiratory distress.     Breath sounds: Normal breath sounds.  Abdominal:     Palpations: Abdomen is soft.     Tenderness: There is no abdominal tenderness.     Hernia: A hernia is present. Hernia is present in the right inguinal area.  Genitourinary:    Penis: Normal.      Testes: Normal.       Comments: Increased swelling right groin.  TTP diffusely right thigh adductor muscles Musculoskeletal:     Cervical back: Neck supple.  Skin:    General: Skin is warm and dry.     Capillary Refill: Capillary refill takes less than 2 seconds.  Neurological:     General: No focal deficit present.     Mental Status: He is alert. Mental status is at baseline.     Motor: No weakness.     Gait: Gait normal.  Psychiatric:        Mood and Affect: Mood normal.        Behavior: Behavior normal.      UC Treatments / Results  Labs (all labs ordered are listed, but only abnormal results are displayed) Labs Reviewed - No data to display  EKG   Radiology No results found.  Procedures Procedures (including critical care time)  Medications Ordered in UC Medications  ketorolac (TORADOL) injection 30 mg (30 mg Intramuscular Given 06/12/23 1237)    Initial Impression / Assessment and Plan / UC Course  I have reviewed the triage vital signs and the nursing notes.  Pertinent labs & imaging results that were available during my care of the patient were reviewed by me and considered in my medical decision making (see chart for details).   53 y/o male  presents for right groin/inner thigh pain and swelling x 3 days. Symptoms began while actively golfing. Also has suspected hernia of the right groin that has been intermittent for months. Seeing PCP on  Monday about the possible hernia.   On exam, he has tenderness and swelling of the thigh adductors muscles diffusely. Also appears to have right inguinal hernia (not extending to testicle).   Patient with acute groin muscle strain and inguinal hernia.   Patient given 30 mg IM ketorolac for acute pain. Advised supportive care, ice, heat Tylenol. Sent naproxen to pharmacy. Advised avoiding painful activities.   Advised to keep appointment with PCP for hernia concerns. He will need [place] and referral to general surgery. At this time, advised to avoid painful activities. ED precautions discussed. Work note given.   Final Clinical Impressions(s) / UC Diagnoses   Final diagnoses:  Strain of right groin  Unilateral inguinal hernia without obstruction or gangrene, recurrence not specified     Discharge Instructions      -You have pulled a muscle in your groin.  Ice the area to help with swelling.  May use heat to help with muscle spasms.  You can also take the muscle relaxer you have at home. - We gave you an injection of anti-inflammatory medicine in the clinic and I sent an anti-inflammatory medication to the pharmacy.  You can also take Tylenol.  Avoid painful activities. - You do appear to have a hernia so keep your appointment with your primary care provider on Monday.  I expect they will probably obtain ultrasound of the area.  You may also discuss further the groin strain.  Sometimes that area is ultrasound as well to see if something is significantly torn.  If so we will need to follow-up with orthopedics.     ED Prescriptions     Medication Sig Dispense Auth. Provider   naproxen (NAPROSYN) 500 MG tablet Take 1 tablet (500 mg total) by mouth 2 (two) times daily as needed for moderate pain  (pain score 4-6). 30 tablet Shirlee Latch, PA-C      I have reviewed the PDMP during this encounter.   Shirlee Latch, PA-C 06/12/23 1300

## 2023-06-15 ENCOUNTER — Encounter: Payer: Self-pay | Admitting: Family Medicine

## 2023-06-15 ENCOUNTER — Other Ambulatory Visit: Payer: Self-pay | Admitting: Family Medicine

## 2023-06-15 ENCOUNTER — Encounter (HOSPITAL_BASED_OUTPATIENT_CLINIC_OR_DEPARTMENT_OTHER): Payer: Self-pay | Admitting: Family Medicine

## 2023-06-15 DIAGNOSIS — K4090 Unilateral inguinal hernia, without obstruction or gangrene, not specified as recurrent: Secondary | ICD-10-CM

## 2023-06-18 ENCOUNTER — Ambulatory Visit
Admission: RE | Admit: 2023-06-18 | Discharge: 2023-06-18 | Disposition: A | Discharge status: Home / Self Care | Source: Ambulatory Visit | Attending: Family Medicine | Admitting: Family Medicine

## 2023-06-18 DIAGNOSIS — K4090 Unilateral inguinal hernia, without obstruction or gangrene, not specified as recurrent: Secondary | ICD-10-CM

## 2023-06-19 ENCOUNTER — Other Ambulatory Visit

## 2023-06-29 ENCOUNTER — Ambulatory Visit (INDEPENDENT_AMBULATORY_CARE_PROVIDER_SITE_OTHER): Admitting: Surgery

## 2023-06-29 ENCOUNTER — Telehealth: Payer: Self-pay | Admitting: Surgery

## 2023-06-29 ENCOUNTER — Encounter: Payer: Self-pay | Admitting: Surgery

## 2023-06-29 VITALS — BP 134/94 | HR 77 | Temp 97.8°F | Ht 72.0 in | Wt 179.8 lb

## 2023-06-29 DIAGNOSIS — K429 Umbilical hernia without obstruction or gangrene: Secondary | ICD-10-CM | POA: Diagnosis not present

## 2023-06-29 DIAGNOSIS — K4090 Unilateral inguinal hernia, without obstruction or gangrene, not specified as recurrent: Secondary | ICD-10-CM

## 2023-06-29 NOTE — Telephone Encounter (Signed)
 Patient is scheduled for inguinal and umbilical hernia surgery for 07/08/23.  Patient states that he needs a work note for today.  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 Is asking if Dr. Aleen Campi will take him out of work or place on restrictions before surgery.  Patient states that with the type of work would be better if he can be placed out of work effective now until recovery from surgery.  Please call patient when ready as he needs to provide this to his employer by tomorrow morning.

## 2023-06-29 NOTE — Telephone Encounter (Signed)
 Patient has been advised of Pre-Admission date/time, and Surgery date at [facility].  Surgery Date: 07/08/23 Preadmission Testing Date: 07/01/23 (phone 8a-1p)  Patient has been made aware to call [phone], between 1-3:00pm the day before surgery, to find out what time to arrive for surgery.

## 2023-06-29 NOTE — H&P (View-Only) (Signed)
 06/29/2023  Reason for Visit: Right inguinal hernia  Requesting Provider: Joen Laura, MD  History of Present Illness: Todd Hancock is a 53 y.o. male presenting for evaluation of a right inguinal hernia.  Patient reports that he has had this hernia likely for about 5 years and becomes aggravated about 4 or so times per year.  The patient does heavy lifting at work and he also has tried to get back to golfing.  The last flareup occurred a couple weeks ago while he was golfing and felt a popping sensation in the right groin.  He is tired of these frequent episodes and would like to seek surgical management for his hernia.  Overall reports that during each episode he does get pain in the right groin and a bulging sensation and this improves after a few days.  Denies any nausea or vomiting, constipation, difficulty voiding.  Denies any symptoms in the left groin or at the umbilicus.  He has not had any prior intra-abdominal surgeries.  He had an ultrasound on 06/18/2023 which showed a right inguinal hernia containing bowel.  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 %   History of kidney stones    Medical history non-contributory    Tendinosis of left rotator cuff    Traumatic incomplete tear of left rotator cuff      Past Surgical History: Past Surgical History:  Procedure Laterality Date   KNEE ARTHROSCOPY Right    SHOULDER ARTHROSCOPY WITH OPEN ROTATOR CUFF REPAIR Left 04/01/2022   Procedure: Left revision arthroscopic rotator cuff repair vs PASTA repair) and glenohumeral debridement;  Surgeon: Signa Kell, MD;  Location: [facility];  Service: Orthopedics;  Laterality: Left;   SHOULDER OPEN ROTATOR CUFF REPAIR Left    TENNIS ELBOW RELEASE/NIRSCHEL PROCEDURE Right 03/31/2016   Procedure: TENNIS ELBOW RELEASE/NIRSCHEL PROCEDURE;  Surgeon: Deeann Saint, MD;  Location: [facility];  Service: Orthopedics;  Laterality: Right;    Home  Medications: Prior to Admission medications   Medication Sig Start Date End Date Taking? Authorizing Provider  naproxen (NAPROSYN) 500 MG tablet Take 1 tablet (500 mg total) by mouth 2 (two) times daily as needed for moderate pain (pain score 4-6). Patient not taking: Reported on 06/29/2023 06/12/23  Yes Eusebio Friendly B, PA-C  ibuprofen (ADVIL) 200 MG tablet Take 400 mg by mouth every 8 (eight) hours as needed (pain.).    [provider]  gabapentin (NEURONTIN) 400 MG capsule Take 1 capsule (400 mg total) by mouth 2 (two) times daily. 03/31/16 10/13/18  Deeann Saint, MD    Allergies: No Known Allergies  Social History:  reports that he has been smoking cigarettes. He has never used smokeless tobacco. He reports that he does not currently use alcohol. He reports that he does not use drugs.   Family History: Family History  Problem Relation Age of Onset   Cancer Father     Review of Systems: Review of Systems  Constitutional:  Negative for chills and fever.  HENT:  Negative for hearing loss.   Respiratory:  Negative for shortness of breath.   Cardiovascular:  Negative for chest pain.  Gastrointestinal:  Positive for abdominal pain (Right groin discomfort). Negative for nausea and vomiting.  Genitourinary:  Negative for dysuria.  Musculoskeletal:  Positive for back pain (chronic).  Skin:  Negative for rash.  Neurological:  Negative for dizziness.  Psychiatric/Behavioral:  Negative for depression.  Physical Exam BP (!) 134/94   Pulse 77   Temp 97.8 F (36.6 C) (Oral)   Ht 6' (1.829 m)   Wt 179 lb 12.8 oz (81.6 kg)   SpO2 97%   BMI 24.39 kg/m  CONSTITUTIONAL: No acute distress, well-nourished HEENT:  Normocephalic, atraumatic, extraocular motion intact. NECK: Trachea is midline, and there is no jugular venous distension.  RESPIRATORY:  Lungs are clear, and breath sounds are equal bilaterally. Normal respiratory effort without pathologic use of accessory  muscles. CARDIOVASCULAR: Heart is regular without murmurs, gallops, or rubs. GI: The abdomen is soft, nondistended, with mild discomfort to palpation in the right groin.  Patient has a reducible right inguinal hernia.  There may be a forming hernia in the left groin as well but difficult to fully discern.  He does have a small less than 1 cm umbilical hernia which is also reducible.   MUSCULOSKELETAL:  Normal muscle strength and tone in all four extremities.  No peripheral edema or cyanosis. SKIN: Skin turgor is normal. There are no pathologic skin lesions.  NEUROLOGIC:  Motor and sensation is grossly normal.  Cranial nerves are grossly intact. PSYCH:  Alert and oriented to person, place and time. Affect is normal.  Laboratory Analysis: No results found for this or any previous visit (from the past 24 hours).  Imaging: Ultrasound right groin on 06/18/2023: FINDINGS: Targeted ultrasound performed of the soft tissues of the right groin was performed both at rest and during Valsalva maneuver. Examination is positive for right inguinal hernia containing fat and peristalsing bowel. Hernia neck measures approximately 0.8 cm in AP dimension.   IMPRESSION: Positive for right inguinal hernia containing bowel.  Assessment and Plan: This is a 53 y.o. male with a right inguinal hernia and umbilical hernia.  - Discussed with patient the findings exam today which confirms a right inguinal hernia as well as a umbilical hernia.  He may have a left inguinal hernia but there is not fully clear on exam today.  Discussed with him that we can certainly offer surgical management and repair of these hernias.  He is in agreement and wants to get this repaired as soon as possible so that he can get back to his normal lifestyle without being worried about the next episode. - Discussed with patient the plan then for robotic right inguinal hernia repair, possible bilateral, and open umbilical hernia repair.  Reviewed the  surgery at length with him including the planned incisions, risks of bleeding, infection, injury to surrounding structures, the ability to evaluate the left groin intraoperatively and repair if there is a hernia present on that side, the use of mesh, that this would be an outpatient procedure, postoperative activity restrictions, pain control, and he is willing to proceed. - Patient will be scheduled for surgery on 07/08/2023.  All of his questions have been answered.  I spent 55 minutes dedicated to the care of this patient on the date of this encounter to include pre-visit review of records, face-to-face time with the patient discussing diagnosis and management, and any post-visit coordination of care.   Howie Ill, MD [facility]

## 2023-06-29 NOTE — Progress Notes (Signed)
 06/29/2023  Reason for Visit: Right inguinal hernia  Requesting Provider: Joen Laura, MD  History of Present Illness: Todd Hancock is a 53 y.o. male presenting for evaluation of a right inguinal hernia.  Patient reports that he has had this hernia likely for about 5 years and becomes aggravated about 4 or so times per year.  The patient does heavy lifting at work and he also has tried to get back to golfing.  The last flareup occurred a couple weeks ago while he was golfing and felt a popping sensation in the right groin.  He is tired of these frequent episodes and would like to seek surgical management for his hernia.  Overall reports that during each episode he does get pain in the right groin and a bulging sensation and this improves after a few days.  Denies any nausea or vomiting, constipation, difficulty voiding.  Denies any symptoms in the left groin or at the umbilicus.  He has not had any prior intra-abdominal surgeries.  He had an ultrasound on 06/18/2023 which showed a right inguinal hernia containing bowel.  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 %   History of kidney stones    Medical history non-contributory    Tendinosis of left rotator cuff    Traumatic incomplete tear of left rotator cuff      Past Surgical History: Past Surgical History:  Procedure Laterality Date   KNEE ARTHROSCOPY Right    SHOULDER ARTHROSCOPY WITH OPEN ROTATOR CUFF REPAIR Left 04/01/2022   Procedure: Left revision arthroscopic rotator cuff repair vs PASTA repair) and glenohumeral debridement;  Surgeon: Signa Kell, MD;  Location: [facility];  Service: Orthopedics;  Laterality: Left;   SHOULDER OPEN ROTATOR CUFF REPAIR Left    TENNIS ELBOW RELEASE/NIRSCHEL PROCEDURE Right 03/31/2016   Procedure: TENNIS ELBOW RELEASE/NIRSCHEL PROCEDURE;  Surgeon: Deeann Saint, MD;  Location: [facility];  Service: Orthopedics;  Laterality: Right;    Home  Medications: Prior to Admission medications   Medication Sig Start Date End Date Taking? Authorizing Provider  naproxen (NAPROSYN) 500 MG tablet Take 1 tablet (500 mg total) by mouth 2 (two) times daily as needed for moderate pain (pain score 4-6). Patient not taking: Reported on 06/29/2023 06/12/23  Yes Eusebio Friendly B, PA-C  ibuprofen (ADVIL) 200 MG tablet Take 400 mg by mouth every 8 (eight) hours as needed (pain.).    [provider]  gabapentin (NEURONTIN) 400 MG capsule Take 1 capsule (400 mg total) by mouth 2 (two) times daily. 03/31/16 10/13/18  Deeann Saint, MD    Allergies: No Known Allergies  Social History:  reports that he has been smoking cigarettes. He has never used smokeless tobacco. He reports that he does not currently use alcohol. He reports that he does not use drugs.   Family History: Family History  Problem Relation Age of Onset   Cancer Father     Review of Systems: Review of Systems  Constitutional:  Negative for chills and fever.  HENT:  Negative for hearing loss.   Respiratory:  Negative for shortness of breath.   Cardiovascular:  Negative for chest pain.  Gastrointestinal:  Positive for abdominal pain (Right groin discomfort). Negative for nausea and vomiting.  Genitourinary:  Negative for dysuria.  Musculoskeletal:  Positive for back pain (chronic).  Skin:  Negative for rash.  Neurological:  Negative for dizziness.  Psychiatric/Behavioral:  Negative for depression.  Physical Exam BP (!) 134/94   Pulse 77   Temp 97.8 F (36.6 C) (Oral)   Ht 6' (1.829 m)   Wt 179 lb 12.8 oz (81.6 kg)   SpO2 97%   BMI 24.39 kg/m  CONSTITUTIONAL: No acute distress, well-nourished HEENT:  Normocephalic, atraumatic, extraocular motion intact. NECK: Trachea is midline, and there is no jugular venous distension.  RESPIRATORY:  Lungs are clear, and breath sounds are equal bilaterally. Normal respiratory effort without pathologic use of accessory  muscles. CARDIOVASCULAR: Heart is regular without murmurs, gallops, or rubs. GI: The abdomen is soft, nondistended, with mild discomfort to palpation in the right groin.  Patient has a reducible right inguinal hernia.  There may be a forming hernia in the left groin as well but difficult to fully discern.  He does have a small less than 1 cm umbilical hernia which is also reducible.   MUSCULOSKELETAL:  Normal muscle strength and tone in all four extremities.  No peripheral edema or cyanosis. SKIN: Skin turgor is normal. There are no pathologic skin lesions.  NEUROLOGIC:  Motor and sensation is grossly normal.  Cranial nerves are grossly intact. PSYCH:  Alert and oriented to person, place and time. Affect is normal.  Laboratory Analysis: No results found for this or any previous visit (from the past 24 hours).  Imaging: Ultrasound right groin on 06/18/2023: FINDINGS: Targeted ultrasound performed of the soft tissues of the right groin was performed both at rest and during Valsalva maneuver. Examination is positive for right inguinal hernia containing fat and peristalsing bowel. Hernia neck measures approximately 0.8 cm in AP dimension.   IMPRESSION: Positive for right inguinal hernia containing bowel.  Assessment and Plan: This is a 53 y.o. male with a right inguinal hernia and umbilical hernia.  - Discussed with patient the findings exam today which confirms a right inguinal hernia as well as a umbilical hernia.  He may have a left inguinal hernia but there is not fully clear on exam today.  Discussed with him that we can certainly offer surgical management and repair of these hernias.  He is in agreement and wants to get this repaired as soon as possible so that he can get back to his normal lifestyle without being worried about the next episode. - Discussed with patient the plan then for robotic right inguinal hernia repair, possible bilateral, and open umbilical hernia repair.  Reviewed the  surgery at length with him including the planned incisions, risks of bleeding, infection, injury to surrounding structures, the ability to evaluate the left groin intraoperatively and repair if there is a hernia present on that side, the use of mesh, that this would be an outpatient procedure, postoperative activity restrictions, pain control, and he is willing to proceed. - Patient will be scheduled for surgery on 07/08/2023.  All of his questions have been answered.  I spent 55 minutes dedicated to the care of this patient on the date of this encounter to include pre-visit review of records, face-to-face time with the patient discussing diagnosis and management, and any post-visit coordination of care.   Howie Ill, MD [facility]

## 2023-06-29 NOTE — Patient Instructions (Signed)
 You have chose to have your hernia repaired. This will be done by Dr. Aleen Campi at [facility].  Please see your (blue) Pre-care information that you have been given today. Our surgery scheduler will call you to verify surgery date and to go over information.   You will need to arrange to be out of work for approximately 1-2 weeks and then you may return with a lifting restriction for 4 more weeks. If you have FMLA or Disability paperwork that needs to be filled out, please have your company fax your paperwork to [phone] or you may drop this by either office. This paperwork will be filled out within 3 days after your surgery has been completed.  You may have a bruise in your groin and also swelling and brusing in your testicle area. You may use ice 4-5 times daily for 15-20 minutes each time. Make sure that you place a barrier between you and the ice pack. To decrease the swelling, you may roll up a bath towel and place it vertically in between your thighs with your testicles resting on the towel. You will want to keep this area elevated as much as possible for several days following surgery.    Inguinal Hernia, Adult Muscles help keep everything in the body in its proper place. But if a weak spot in the muscles develops, something can poke through. That is called a hernia. When this happens in the lower part of the belly (abdomen), it is called an inguinal hernia. (It takes its name from a part of the body in this region called the inguinal canal.) A weak spot in the wall of muscles lets some fat or part of the small intestine bulge through. An inguinal hernia can develop at any age. Men get them more often than women. CAUSES  In adults, an inguinal hernia develops over time. It can be triggered by: Suddenly straining the muscles of the lower abdomen. Lifting heavy objects. Straining to have a bowel movement. Difficult bowel movements (constipation) can lead to this. Constant coughing. This  may be caused by smoking or lung disease. Being overweight. Being pregnant. Working at a job that requires long periods of standing or heavy lifting. Having had an inguinal hernia before. One type can be an emergency situation. It is called a strangulated inguinal hernia. It develops if part of the small intestine slips through the weak spot and cannot get back into the abdomen. The blood supply can be cut off. If that happens, part of the intestine may die. This situation requires emergency surgery. SYMPTOMS  Often, a small inguinal hernia has no symptoms. It is found when a healthcare provider does a physical exam. Larger hernias usually have symptoms.  In adults, symptoms may include: A lump in the groin. This is easier to see when the person is standing. It might disappear when lying down. In men, a lump in the scrotum. Pain or burning in the groin. This occurs especially when lifting, straining or coughing. A dull ache or feeling of pressure in the groin. Signs of a strangulated hernia can include: A bulge in the groin that becomes very painful and tender to the touch. A bulge that turns red or purple. Fever, nausea and vomiting. Inability to have a bowel movement or to pass gas. DIAGNOSIS  To decide if you have an inguinal hernia, a healthcare provider will probably do a physical examination. This will include asking questions about any symptoms you have noticed. The healthcare provider might  feel the groin area and ask you to cough. If an inguinal hernia is felt, the healthcare provider may try to slide it back into the abdomen. Usually no other tests are needed. TREATMENT  Treatments can vary. The size of the hernia makes a difference. Options include: Watchful waiting. This is often suggested if the hernia is small and you have had no symptoms. No medical procedure will be done unless symptoms develop. You will need to watch closely for symptoms. If any occur, contact your  healthcare provider right away. Surgery. This is used if the hernia is larger or you have symptoms. Open surgery. This is usually an outpatient procedure (you will not stay overnight in a hospital). An cut (incision) is made through the skin in the groin. The hernia is put back inside the abdomen. The weak area in the muscles is then repaired by herniorrhaphy or hernioplasty. Herniorrhaphy: in this type of surgery, the weak muscles are sewn back together. Hernioplasty: a patch or mesh is used to close the weak area in the abdominal wall. Laparoscopy. In this procedure, a surgeon makes small incisions. A thin tube with a tiny video camera (called a laparoscope) is put into the abdomen. The surgeon repairs the hernia with mesh by looking with the video camera and using two long instruments. HOME CARE INSTRUCTIONS  After surgery to repair an inguinal hernia: You will need to take pain medicine prescribed by your healthcare provider. Follow all directions carefully. You will need to take care of the wound from the incision. Your activity will be restricted for awhile. This will probably include no heavy lifting for several weeks. You also should not do anything too active for a few weeks. When you can return to work will depend on the type of job that you have. During "watchful waiting" periods, you should: Maintain a healthy weight. Eat a diet high in fiber (fruits, vegetables and whole grains). Drink plenty of fluids to avoid constipation. This means drinking enough water and other liquids to keep your urine clear or pale yellow. Do not lift heavy objects. Do not stand for long periods of time. Quit smoking. This should keep you from developing a frequent cough. SEEK MEDICAL CARE IF:  A bulge develops in your groin area. You feel pain, a burning sensation or pressure in the groin. This might be worse if you are lifting or straining. You develop a fever of more than 100.5 F (38.1 C). SEEK  IMMEDIATE MEDICAL CARE IF:  Pain in the groin increases suddenly. A bulge in the groin gets bigger suddenly and does not go down. For men, there is sudden pain in the scrotum. Or, the size of the scrotum increases. A bulge in the groin area becomes red or purple and is painful to touch. You have nausea or vomiting that does not go away. You feel your heart beating much faster than normal. You cannot have a bowel movement or pass gas. You develop a fever of more than 102.0 F (38.9 C).   This information is not intended to replace advice given to you by your health care provider. Make sure you discuss any questions you have with your health care provider.   Document Released: 08/17/2008 Document Revised: 06/23/2011 Document Reviewed: 10/02/2014 Elsevier Interactive Patient Education [employer]

## 2023-06-30 ENCOUNTER — Telehealth: Payer: Self-pay | Admitting: Surgery

## 2023-06-30 NOTE — Telephone Encounter (Signed)
 Work note with restrictions is faxed to patient's employer [employer] Attention Truitt Merle, faxed to [phone].

## 2023-07-01 ENCOUNTER — Encounter
Admission: RE | Admit: 2023-07-01 | Discharge: 2023-07-01 | Disposition: A | Discharge status: Home / Self Care | Source: Ambulatory Visit | Attending: Surgery | Admitting: Surgery

## 2023-07-01 ENCOUNTER — Other Ambulatory Visit

## 2023-07-01 ENCOUNTER — Other Ambulatory Visit: Payer: Self-pay

## 2023-07-01 NOTE — Patient Instructions (Addendum)
 Your procedure is scheduled on: 07/08/23 - Wednesday Report to the Registration Desk on the 1st floor of the [facility]. To find out your arrival time, please call [phone] between 1PM - 3PM on: 07/07/23 - Tuesday If your arrival time is 6:00 am, do not arrive before that time as the [facility] entrance doors do not open until 6:00 am.  REMEMBER: Instructions that are not followed completely may result in serious medical risk, up to and including death; or upon the discretion of your surgeon and anesthesiologist your surgery may need to be rescheduled.  Do not eat food after midnight the night before surgery.  No gum chewing or hard candies.  You may however, drink CLEAR liquids up to 2 hours before you are scheduled to arrive for your surgery. Do not drink anything within 2 hours of your scheduled arrival time.  Clear liquids include: - water  - apple juice without pulp - gatorade (not RED colors) - black coffee or tea (Do NOT add milk or creamers to the coffee or tea) Do NOT drink anything that is not on this list.  One week prior to surgery: Stop Anti-inflammatories (NSAIDS) such as Advil, Aleve, Ibuprofen, Motrin, Naproxen, Naprosyn and Aspirin based products such as Excedrin, Goody's Powder, BC Powder. You may take Tylenol if needed for pain up until the day of surgery.  Stop ANY OVER THE COUNTER supplements until after surgery.  ON THE DAY OF SURGERY ONLY TAKE THESE MEDICATIONS WITH SIPS OF WATER:  none   No Alcohol for 24 hours before or after surgery.  No Smoking including e-cigarettes for 24 hours before surgery.  No chewable tobacco products for at least 6 hours before surgery.  No nicotine patches on the day of surgery.  Do not use any "recreational" drugs for at least a week (preferably 2 weeks) before your surgery.  Please be advised that the combination of cocaine and anesthesia may have negative outcomes, up to and including death. If you test positive  for cocaine, your surgery will be cancelled.  On the morning of surgery brush your teeth with toothpaste and water, you may rinse your mouth with mouthwash if you wish. Do not swallow any toothpaste or mouthwash.  Use CHG Soap or wipes as directed on instruction sheet.  Do not wear jewelry, make-up, hairpins, clips or nail polish.  For welded (permanent) jewelry: bracelets, anklets, waist bands, etc.  Please have this removed prior to surgery.  If it is not removed, there is a chance that hospital personnel will need to cut it off on the day of surgery.  Do not wear lotions, powders, or perfumes.   Do not shave body hair from the neck down 48 hours before surgery.  Contact lenses, hearing aids and dentures may not be worn into surgery.  Do not bring valuables to the hospital. [facility] is not responsible for any missing/lost belongings or valuables.   Notify your doctor if there is any change in your medical condition (cold, fever, infection).  Wear comfortable clothing (specific to your surgery type) to the hospital.  After surgery, you can help prevent lung complications by doing breathing exercises.  Take deep breaths and cough every 1-2 hours. Your doctor may order a device called an Incentive Spirometer to help you take deep breaths.  When coughing or sneezing, hold a pillow firmly against your incision with both hands. This is called "splinting." Doing this helps protect your incision. It also decreases belly discomfort.  If  you are being admitted to the hospital overnight, leave your suitcase in the car. After surgery it may be brought to your room.  In case of increased patient census, it may be necessary for you, the patient, to continue your postoperative care in the Same Day Surgery department.  If you are being discharged the day of surgery, you will not be allowed to drive home. You will need a responsible individual to drive you home and stay with you for 24 hours  after surgery.   If you are taking public transportation, you will need to have a responsible individual with you.  Please call the Pre-admissions Testing Dept. at [phone] if you have any questions about these instructions.  Surgery Visitation Policy:  Patients having surgery or a procedure may have two visitors.  Children under the age of 79 must have an adult with them who is not the patient.  Temporary Visitor Restrictions Due to increasing cases of flu, RSV and COVID-19: Children ages 49 and under will not be able to visit patients in [facility] hospitals under most circumstances.  Inpatient Visitation:    Visiting hours are 7 a.m. to 8 p.m. Up to four visitors are allowed at one time in a patient room. The visitors may rotate out with other people during the day.  One visitor age 70 or older may stay with the patient overnight and must be in the room by 8 p.m.    Preparing for Surgery with CHLORHEXIDINE GLUCONATE (CHG) Soap  Chlorhexidine Gluconate (CHG) Soap  o An antiseptic cleaner that kills germs and bonds with the skin to continue killing germs even after washing  o Used for showering the night before surgery and morning of surgery  Before surgery, you can play an important role by reducing the number of germs on your skin.  CHG (Chlorhexidine gluconate) soap is an antiseptic cleanser which kills germs and bonds with the skin to continue killing germs even after washing.  Please do not use if you have an allergy to CHG or antibacterial soaps. If your skin becomes reddened/irritated stop using the CHG.  1. Shower the NIGHT BEFORE SURGERY and the MORNING OF SURGERY with CHG soap.  2. If you choose to wash your hair, wash your hair first as usual with your normal shampoo.  3. After shampooing, rinse your hair and body thoroughly to remove the shampoo.  4. Use CHG as you would any other liquid soap. You can apply CHG directly to the skin and wash gently with a  scrungie or a clean washcloth.  5. Apply the CHG soap to your body only from the neck down. Do not use on open wounds or open sores. Avoid contact with your eyes, ears, mouth, and genitals (private parts). Wash face and genitals (private parts) with your normal soap.  6. Wash thoroughly, paying special attention to the area where your surgery will be performed.  7. Thoroughly rinse your body with warm water.  8. Do not shower/wash with your normal soap after using and rinsing off the CHG soap.  9. Pat yourself dry with a clean towel.  10. Wear clean pajamas to bed the night before surgery.  12. Place clean sheets on your bed the night of your first shower and do not sleep with pets.  13. Shower again with the CHG soap on the day of surgery prior to arriving at the hospital.  14. Do not apply any deodorants/lotions/powders.  15. Please wear clean clothes to  the hospital.

## 2023-07-07 ENCOUNTER — Telehealth: Payer: Self-pay | Admitting: *Deleted

## 2023-07-07 MED ORDER — PROPOFOL 1000 MG/100ML IV EMUL
INTRAVENOUS | Status: AC
  Filled 2023-07-07: qty 100

## 2023-07-07 NOTE — Telephone Encounter (Signed)
Faxed FMLA to [facility] at [phone]

## 2023-07-08 ENCOUNTER — Encounter
Admission: RE | Disposition: A | Discharge status: Home / Self Care | Payer: Self-pay | Source: Home / Self Care | Attending: Surgery

## 2023-07-08 ENCOUNTER — Other Ambulatory Visit: Payer: Self-pay

## 2023-07-08 ENCOUNTER — Encounter: Payer: Self-pay | Admitting: Surgery

## 2023-07-08 ENCOUNTER — Ambulatory Visit: Admitting: Anesthesiology

## 2023-07-08 ENCOUNTER — Ambulatory Visit
Admission: RE | Admit: 2023-07-08 | Discharge: 2023-07-08 | Disposition: A | Discharge status: Home / Self Care | Attending: Surgery | Admitting: Surgery

## 2023-07-08 DIAGNOSIS — K429 Umbilical hernia without obstruction or gangrene: Secondary | ICD-10-CM | POA: Diagnosis present

## 2023-07-08 DIAGNOSIS — F17210 Nicotine dependence, cigarettes, uncomplicated: Secondary | ICD-10-CM | POA: Insufficient documentation

## 2023-07-08 DIAGNOSIS — K4090 Unilateral inguinal hernia, without obstruction or gangrene, not specified as recurrent: Secondary | ICD-10-CM | POA: Diagnosis present

## 2023-07-08 HISTORY — PX: UMBILICAL HERNIA REPAIR: SHX196

## 2023-07-08 HISTORY — PX: XI ROBOTIC ASSISTED INGUINAL HERNIA REPAIR WITH MESH: SHX6706

## 2023-07-08 SURGERY — REPAIR, HERNIA, UMBILICAL, ADULT
Anesthesia: General | Laterality: Right

## 2023-07-08 MED ORDER — FENTANYL CITRATE (PF) 100 MCG/2ML IJ SOLN
INTRAMUSCULAR | Status: AC
  Filled 2023-07-08: qty 2

## 2023-07-08 MED ORDER — OXYCODONE HCL 5 MG PO TABS: 5.0000 mg | ORAL_TABLET | ORAL | 0 refills | Status: DC | PRN

## 2023-07-08 MED ORDER — ACETAMINOPHEN 500 MG PO TABS
1000.0000 mg | ORAL_TABLET | ORAL | Status: AC
  Administered 2023-07-08: 1000 mg via ORAL

## 2023-07-08 MED ORDER — LACTATED RINGERS IV SOLN: INTRAVENOUS | Status: DC

## 2023-07-08 MED ORDER — ROCURONIUM BROMIDE 100 MG/10ML IV SOLN
INTRAVENOUS | Status: DC | PRN
  Administered 2023-07-08 (×2): 50 mg via INTRAVENOUS

## 2023-07-08 MED ORDER — GABAPENTIN 300 MG PO CAPS
ORAL_CAPSULE | ORAL | Status: AC
  Filled 2023-07-08: qty 1

## 2023-07-08 MED ORDER — ACETAMINOPHEN 500 MG PO TABS
ORAL_TABLET | ORAL | Status: AC
  Filled 2023-07-08: qty 2

## 2023-07-08 MED ORDER — CHLORHEXIDINE GLUCONATE 0.12 % MT SOLN
OROMUCOSAL | Status: AC
  Filled 2023-07-08: qty 15

## 2023-07-08 MED ORDER — OXYCODONE HCL 5 MG PO TABS
5.0000 mg | ORAL_TABLET | Freq: Once | ORAL | Status: AC | PRN
  Administered 2023-07-08: 5 mg via ORAL

## 2023-07-08 MED ORDER — PROPOFOL 10 MG/ML IV BOLUS
INTRAVENOUS | Status: AC
  Filled 2023-07-08: qty 20

## 2023-07-08 MED ORDER — OXYCODONE HCL 5 MG PO TABS
ORAL_TABLET | ORAL | Status: AC
Start: 2023-07-08 — End: ?
  Filled 2023-07-08: qty 1

## 2023-07-08 MED ORDER — 0.9 % SODIUM CHLORIDE (POUR BTL) OPTIME
TOPICAL | Status: DC | PRN
  Administered 2023-07-08: 500 mL

## 2023-07-08 MED ORDER — PHENYLEPHRINE HCL-NACL 20-0.9 MG/250ML-% IV SOLN
INTRAVENOUS | Status: AC
  Filled 2023-07-08: qty 250

## 2023-07-08 MED ORDER — GABAPENTIN 300 MG PO CAPS
300.0000 mg | ORAL_CAPSULE | ORAL | Status: AC
  Administered 2023-07-08: 300 mg via ORAL

## 2023-07-08 MED ORDER — ACETAMINOPHEN 500 MG PO TABS: 1000.0000 mg | ORAL_TABLET | Freq: Four times a day (QID) | ORAL | Status: AC | PRN

## 2023-07-08 MED ORDER — CHLORHEXIDINE GLUCONATE CLOTH 2 % EX PADS
6.0000 | MEDICATED_PAD | Freq: Once | CUTANEOUS | Status: DC
Start: 2023-07-08 — End: 2023-07-08

## 2023-07-08 MED ORDER — SUGAMMADEX SODIUM 500 MG/5ML IV SOLN
INTRAVENOUS | Status: DC | PRN
  Administered 2023-07-08: 200 mg via INTRAVENOUS

## 2023-07-08 MED ORDER — MIDAZOLAM HCL 2 MG/2ML IJ SOLN
INTRAMUSCULAR | Status: AC
  Filled 2023-07-08: qty 2

## 2023-07-08 MED ORDER — HYDROMORPHONE HCL 1 MG/ML IJ SOLN
INTRAMUSCULAR | Status: AC
  Filled 2023-07-08: qty 1

## 2023-07-08 MED ORDER — ONDANSETRON HCL 4 MG/2ML IJ SOLN
INTRAMUSCULAR | Status: DC | PRN
  Administered 2023-07-08: 4 mg via INTRAVENOUS

## 2023-07-08 MED ORDER — CHLORHEXIDINE GLUCONATE 0.12 % MT SOLN
15.0000 mL | Freq: Once | OROMUCOSAL | Status: AC
  Administered 2023-07-08: 15 mL via OROMUCOSAL

## 2023-07-08 MED ORDER — FENTANYL CITRATE (PF) 100 MCG/2ML IJ SOLN
INTRAMUSCULAR | Status: AC
Start: 2023-07-08 — End: ?
  Filled 2023-07-08: qty 2

## 2023-07-08 MED ORDER — LIDOCAINE HCL (CARDIAC) PF 100 MG/5ML IV SOSY
PREFILLED_SYRINGE | INTRAVENOUS | Status: DC | PRN
  Administered 2023-07-08: 60 mg via INTRAVENOUS

## 2023-07-08 MED ORDER — MIDAZOLAM HCL 2 MG/2ML IJ SOLN
INTRAMUSCULAR | Status: DC | PRN
  Administered 2023-07-08: 4 mg via INTRAVENOUS

## 2023-07-08 MED ORDER — IBUPROFEN 800 MG PO TABS: 800.0000 mg | ORAL_TABLET | Freq: Three times a day (TID) | ORAL | 1 refills | Status: AC | PRN

## 2023-07-08 MED ORDER — HYDROMORPHONE HCL 1 MG/ML IJ SOLN
INTRAMUSCULAR | Status: DC | PRN
  Administered 2023-07-08 (×2): .5 mg via INTRAVENOUS

## 2023-07-08 MED ORDER — CEFAZOLIN SODIUM-DEXTROSE 2-4 GM/100ML-% IV SOLN
INTRAVENOUS | Status: AC
  Filled 2023-07-08: qty 100

## 2023-07-08 MED ORDER — OXYCODONE HCL 5 MG/5ML PO SOLN: 5.0000 mg | Freq: Once | ORAL | Status: AC | PRN

## 2023-07-08 MED ORDER — CEFAZOLIN SODIUM-DEXTROSE 2-4 GM/100ML-% IV SOLN
2.0000 g | INTRAVENOUS | Status: AC
  Administered 2023-07-08: 2 g via INTRAVENOUS

## 2023-07-08 MED ORDER — LABETALOL HCL 5 MG/ML IV SOLN
INTRAVENOUS | Status: DC | PRN
  Administered 2023-07-08: 10 mg via INTRAVENOUS

## 2023-07-08 MED ORDER — SEVOFLURANE IN SOLN
RESPIRATORY_TRACT | Status: AC
  Filled 2023-07-08: qty 250

## 2023-07-08 MED ORDER — FENTANYL CITRATE (PF) 100 MCG/2ML IJ SOLN
INTRAMUSCULAR | Status: DC | PRN
  Administered 2023-07-08 (×2): 100 ug via INTRAVENOUS

## 2023-07-08 MED ORDER — CHLORHEXIDINE GLUCONATE CLOTH 2 % EX PADS: 6.0000 | MEDICATED_PAD | Freq: Once | CUTANEOUS | Status: DC

## 2023-07-08 MED ORDER — KETOROLAC TROMETHAMINE 30 MG/ML IJ SOLN
INTRAMUSCULAR | Status: DC | PRN
  Administered 2023-07-08: 30 mg via INTRAVENOUS

## 2023-07-08 MED ORDER — BUPIVACAINE-EPINEPHRINE (PF) 0.5% -1:200000 IJ SOLN
INTRAMUSCULAR | Status: AC
  Filled 2023-07-08: qty 30

## 2023-07-08 MED ORDER — ORAL CARE MOUTH RINSE: 15.0000 mL | Freq: Once | OROMUCOSAL | Status: AC

## 2023-07-08 MED ORDER — BUPIVACAINE-EPINEPHRINE 0.5% -1:200000 IJ SOLN
INTRAMUSCULAR | Status: DC | PRN
  Administered 2023-07-08: 40 mL via INTRAMUSCULAR

## 2023-07-08 MED ORDER — DEXAMETHASONE SODIUM PHOSPHATE 10 MG/ML IJ SOLN
INTRAMUSCULAR | Status: DC | PRN
  Administered 2023-07-08: 10 mg via INTRAVENOUS

## 2023-07-08 MED ORDER — FENTANYL CITRATE (PF) 100 MCG/2ML IJ SOLN
25.0000 ug | INTRAMUSCULAR | Status: DC | PRN
  Administered 2023-07-08 (×2): 50 ug via INTRAVENOUS

## 2023-07-08 MED ORDER — BUPIVACAINE LIPOSOME 1.3 % IJ SUSP: 20.0000 mL | Freq: Once | INTRAMUSCULAR | Status: DC

## 2023-07-08 MED ORDER — PROPOFOL 10 MG/ML IV BOLUS
INTRAVENOUS | Status: DC | PRN
Start: 2023-07-08 — End: 2023-07-08
  Administered 2023-07-08: 170 mg via INTRAVENOUS

## 2023-07-08 SURGICAL SUPPLY — 57 items
BLADE SURG 15 STRL LF DISP TIS (BLADE) ×2 IMPLANT
CHLORAPREP W/TINT 26 (MISCELLANEOUS) IMPLANT
COVER TIP SHEARS 8 DVNC (MISCELLANEOUS) ×2 IMPLANT
COVER WAND RF STERILE (DRAPES) ×2 IMPLANT
DERMABOND ADVANCED .7 DNX12 (GAUZE/BANDAGES/DRESSINGS) ×2 IMPLANT
DRAPE ARM DVNC X/XI (DISPOSABLE) ×6 IMPLANT
DRAPE COLUMN DVNC XI (DISPOSABLE) ×2 IMPLANT
DRAPE LAPAROTOMY 77X122 PED (DRAPES) ×2 IMPLANT
ELECT CAUTERY BLADE TIP 2.5 (TIP) ×2 IMPLANT
ELECT REM PT RETURN 9FT ADLT (ELECTROSURGICAL) ×2 IMPLANT
ELECTRODE CAUTERY BLDE TIP 2.5 (TIP) ×2 IMPLANT
ELECTRODE REM PT RTRN 9FT ADLT (ELECTROSURGICAL) ×2 IMPLANT
FORCEPS BPLR R/ABLATION 8 DVNC (INSTRUMENTS) ×2 IMPLANT
GAUZE 4X4 16PLY ~~LOC~~+RFID DBL (SPONGE) ×2 IMPLANT
GLOVE SURG SYN 7.0 (GLOVE) ×4 IMPLANT
GLOVE SURG SYN 7.0 PF PI (GLOVE) ×4 IMPLANT
GLOVE SURG SYN 7.5 E (GLOVE) ×4 IMPLANT
GLOVE SURG SYN 7.5 PF PI (GLOVE) ×4 IMPLANT
GOWN STRL REUS W/ TWL LRG LVL3 (GOWN DISPOSABLE) ×8 IMPLANT
IRRIGATION STRYKERFLOW (MISCELLANEOUS) ×2 IMPLANT
IRRIGATOR STRYKERFLOW (MISCELLANEOUS) IMPLANT
IV NS 1000ML BAXH (IV SOLUTION) IMPLANT
KIT PINK PAD W/HEAD ARE REST (MISCELLANEOUS) ×2 IMPLANT
KIT PINK PAD W/HEAD ARM REST (MISCELLANEOUS) ×2 IMPLANT
LABEL OR SOLS (LABEL) ×2 IMPLANT
MANIFOLD NEPTUNE II (INSTRUMENTS) ×2 IMPLANT
MESH 3DMAX MID 4X6 RT LRG (Mesh General) IMPLANT
NDL DRIVE SUT CUT DVNC (INSTRUMENTS) ×2 IMPLANT
NDL HYPO 22X1.5 SAFETY MO (MISCELLANEOUS) ×2 IMPLANT
NDL INSUFFLATION 14GA 120MM (NEEDLE) ×2 IMPLANT
NEEDLE DRIVE SUT CUT DVNC (INSTRUMENTS) ×2 IMPLANT
NEEDLE HYPO 22X1.5 SAFETY MO (MISCELLANEOUS) ×2 IMPLANT
NEEDLE INSUFFLATION 14GA 120MM (NEEDLE) ×2 IMPLANT
NS IRRIG 500ML POUR BTL (IV SOLUTION) ×2 IMPLANT
OBTURATOR OPTICAL STND 8 DVNC (TROCAR) ×2 IMPLANT
OBTURATOR OPTICALSTD 8 DVNC (TROCAR) ×2 IMPLANT
PACK BASIN MINOR ARMC (MISCELLANEOUS) ×2 IMPLANT
PACK LAP CHOLECYSTECTOMY (MISCELLANEOUS) ×2 IMPLANT
SCISSORS MNPLR CVD DVNC XI (INSTRUMENTS) ×2 IMPLANT
SEAL UNIV 5-12 XI (MISCELLANEOUS) ×6 IMPLANT
SET TUBE SMOKE EVAC HIGH FLOW (TUBING) ×2 IMPLANT
SOL ELECTROSURG ANTI STICK (MISCELLANEOUS) ×2 IMPLANT
SOLUTION ELECTROSURG ANTI STCK (MISCELLANEOUS) ×2 IMPLANT
SPONGE T-LAP 18X18 ~~LOC~~+RFID (SPONGE) ×2 IMPLANT
SUT ETHIBOND 0 MO6 C/R (SUTURE) ×2 IMPLANT
SUT MNCRL AB 4-0 PS2 18 (SUTURE) ×2 IMPLANT
SUT STRATA 2-0 23CM CT-2 (SUTURE) ×2 IMPLANT
SUT VIC AB 2-0 SH 27XBRD (SUTURE) ×4 IMPLANT
SUT VIC AB 3-0 SH 27X BRD (SUTURE) ×2 IMPLANT
SUT VICRYL 0 UR6 27IN ABS (SUTURE) ×4 IMPLANT
SYR 20ML LL LF (SYRINGE) ×2 IMPLANT
SYS BAG RETRIEVAL 10MM (BASKET) IMPLANT
SYSTEM BAG RETRIEVAL 10MM (BASKET) IMPLANT
TAPE TRANSPORE STRL 2 31045 (GAUZE/BANDAGES/DRESSINGS) ×2 IMPLANT
TRAP FLUID SMOKE EVACUATOR (MISCELLANEOUS) ×2 IMPLANT
TRAY FOLEY SLVR 16FR LF STAT (SET/KITS/TRAYS/PACK) ×2 IMPLANT
WATER STERILE IRR 500ML POUR (IV SOLUTION) ×2 IMPLANT

## 2023-07-08 NOTE — Discharge Instructions (Signed)
 Discharge Instructions: 1.  Patient may shower, but do not scrub wounds heavily and dab dry only. 2.  Do not submerge wounds in pool/tub until fully healed. 3.  Do not apply ointments or hydrogen peroxide to the wounds. 4.  May apply ice packs to the wounds for comfort. 5.  It is normal for there to be swelling or bruising in the groin after this surgery.  This will resolve on its own. 6.  Do not drive while taking narcotics for pain control.  Prior to driving, make sure you are able to rotate right and left to look at blindspots without significant pain or discomfort. 7.  No heavy lifting or pushing of more than 10-15 lbs for 6 weeks.

## 2023-07-08 NOTE — Anesthesia Preprocedure Evaluation (Addendum)
 Anesthesia Evaluation  Patient identified by MRN, date of birth, ID band Patient awake    Reviewed: Allergy & Precautions, H&P , NPO status , Patient's Chart, lab work & pertinent test results, reviewed documented beta blocker date and time   Airway Mallampati: II  TM Distance: >3 FB Neck ROM: full    Dental  (+) Teeth Intact, Dental Advidsory Given   Pulmonary Current Smoker and Patient abstained from smoking.   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, right     Comment:  90 % No date: History of kidney stones No date: Medical history non-contributory No date: Tendinosis of left rotator cuff No date: Traumatic incomplete tear of left rotator cuff Past Surgical History: No date: KNEE ARTHROSCOPY; Right No date: SHOULDER OPEN ROTATOR CUFF REPAIR; Left 03/31/2016: TENNIS ELBOW RELEASE/NIRSCHEL PROCEDURE; Right     Comment:  Procedure: TENNIS ELBOW RELEASE/NIRSCHEL PROCEDURE;                Surgeon: Deeann Saint, MD;  Location: [facility] ORS;                Service: Orthopedics;  Laterality: Right;   Reproductive/Obstetrics negative OB ROS                             Anesthesia Physical Anesthesia Plan  ASA: 2  Anesthesia Plan: General ETT   Post-op Pain Management:    Induction: Intravenous  PONV Risk Score and Plan: 2 and Ondansetron, Dexamethasone and Midazolam  Airway Management Planned: Oral ETT  Additional Equipment:   Intra-op Plan:   Post-operative  Plan: Extubation in OR  Informed Consent: I have reviewed the patients History and Physical, chart, labs and discussed the procedure including the risks, benefits and alternatives for the proposed anesthesia with the patient or authorized representative who has indicated his/her understanding and acceptance.     Dental Advisory Given  Plan Discussed with: Anesthesiologist, CRNA and Surgeon  Anesthesia Plan Comments: (Patient consented for risks of anesthesia including but not limited to:  - adverse reactions to medications - damage to eyes, teeth, lips or other oral mucosa - nerve damage due to positioning  - sore throat or hoarseness - Damage to heart, brain, nerves, lungs, other parts of body or loss of life  Patient voiced understanding and assent.)       Anesthesia Quick Evaluation

## 2023-07-08 NOTE — Anesthesia Procedure Notes (Addendum)
 Procedure Name: Intubation Date/Time: 07/08/2023 7:47 AM  Performed by: Edmund Hilda, CRNAPre-anesthesia Checklist: Patient identified, Patient being monitored, Timeout performed, Emergency Drugs available and Suction available Patient Re-evaluated:Patient Re-evaluated prior to induction Oxygen Delivery Method: Circle system utilized Preoxygenation: Pre-oxygenation with 100% oxygen Induction Type: IV induction Ventilation: Mask ventilation without difficulty Laryngoscope Size: Mac and 3 Grade View: Grade I Tube type: Oral Tube size: 7.5 mm Number of attempts: 1 Airway Equipment and Method: Stylet Placement Confirmation: ETT inserted through vocal cords under direct vision, positive ETCO2 and breath sounds checked- equal and bilateral Secured at: 23 cm Tube secured with: Tape Dental Injury: Teeth and Oropharynx as per pre-operative assessment  Comments: Pastient with poor dentition. Inflamed glottis

## 2023-07-08 NOTE — Transfer of Care (Signed)
 Immediate Anesthesia Transfer of Care Note  Patient: Todd Hancock  Procedure(s) Performed: REPAIR, HERNIA, UMBILICAL, ADULT REPAIR, HERNIA, INGUINAL, ROBOT-ASSISTED, LAPAROSCOPIC, USING MESH (Right)  Patient Location: PACU  Anesthesia Type:General  Level of Consciousness: awake, alert , and oriented  Airway & Oxygen Therapy: Patient Spontanous Breathing and Patient connected to face mask oxygen  Post-op Assessment: Report given to RN, Post -op Vital signs reviewed and stable, and Patient moving all extremities  Post vital signs: Reviewed and stable  Last Vitals:  Vitals Value Taken Time  BP 157/94 07/08/23 0945  Temp    Pulse 70 07/08/23 0945  Resp 18 07/08/23 0945  SpO2 97 % 07/08/23 0945  Vitals shown include unfiled device data.  Last Pain:  Vitals:   07/08/23 0622  TempSrc: Temporal  PainSc: 3          Complications: No notable events documented.

## 2023-07-08 NOTE — Interval H&P Note (Signed)
 History and Physical Interval Note:  07/08/2023 7:03 AM  Todd Hancock  has presented today for surgery, with the diagnosis of right inguinal hernia  umbilical hernia less 3 cm.  The various methods of treatment have been discussed with the patient and family. After consideration of risks, benefits and other options for treatment, the patient has consented to  Procedure(s) with comments: REPAIR, HERNIA, UMBILICAL, ADULT (N/A) REPAIR, HERNIA, INGUINAL, ROBOT-ASSISTED, LAPAROSCOPIC, USING MESH (Right) - Possible bilateral as a surgical intervention.  The patient's history has been reviewed, patient examined, no change in status, stable for surgery.  I have reviewed the patient's chart and labs.  Questions were answered to the patient's satisfaction.     [name]

## 2023-07-08 NOTE — Op Note (Signed)
 Procedure Date:  07/08/2023  Pre-operative Diagnosis:  Right inguinal hernia, reducible umbilical hernia  Post-operative Diagnosis: Right inguinal hernia, reducible umbilical hernia 1 cm.  Procedure: 1.  Robotic assisted right Inguinal Hernia Repair 2.  Creation of a right Posterior Rectus-Transversalis Fascia Advancment Flap for Coverage of Pelvic Wound (200 cm) 3.  Open umbilical hernia repair.  Surgeon:  Howie Ill, MD  Anesthesia:  General endotracheal  Estimated Blood Loss:  10 ml  Specimens:  None  Complications:  None  Indications for Procedure:  This is a 53 y.o. male who presents with a right inguinal hernia.  The options of surgery versus observation were reviewed with the patient and/or family. The risks of bleeding, abscess or infection, recurrence of symptoms, potential for an open procedure, injury to surrounding structures, and chronic pain were all discussed with the patient and he was willing to proceed.  We have planned this transabdominal procedure with the creation of a right peritoneal flap based on the posterior rectus sheath and transversalis fascia in order to fully cover the mesh, creating a natural tisssue barrier for the bowel and peritoneal cavity.  Description of Procedure: The patient was correctly identified in the preoperative area and brought into the operating room.  The patient was placed supine with VTE prophylaxis in place.  Appropriate time-outs were performed.  Anesthesia was induced and the patient was intubated.  Foley catheter was placed.  Appropriate antibiotics were infused.  The abdomen was prepped and draped in a sterile fashion. A supraumbilical incision was made. Cautery was used to dissect along the umbilical stalk.  The stalk was then separated from the underlying fascia, revealing a 1 cm defect.  The fascial edges were cleared and a 12 mm port was inserted.  Pneumoperitoneum was obtained with appropriate opening pressures.  A  Veress needle was used to start dissecting the peritoneal flap.  Two 8-mm robotic ports were placed in the right and left lateral positions under direct visualization.  A large right Bard 3D Max Mesh, a 2-0 Vicryl, and 2-0 vlock suture were placed through the umbilical port under direct visualization.  The [employer] platform was docked onto the patient, the camera was inserted and targeted, and the instruments were placed under direct visualization.  Both inguinal regions were inspected for hernias and it was confirmed that the patient had a right inguinal hernia, with no defects or bulging on the left side.  Using electocautery, the peritoneal and posterior rectus tissue flap was created.  The peritoneum on the right side was scored from the median umbilical ligament laterally towards the ASIS.  The flap was mobilized using robotic scissors and the bipolar instruments, creating a plane along the posterior rectus sheath and transversalis fascia down to the pubic tubercle medially. It was then further mobilized laterally across the inguinal canal and femoral vessels and onto the psoas muscle. The inferior epigastric vessels were identified and preserved. This created a posterior rectus and peritoneal flap measuring roughly 17 cm x 12 cm.  The hernia sac and contents were reduced preserving all structures.  The patient had a cord lipoma which was resected.  A large right Bard 3D Max Mid mesh was placed with good overlap along all the potential hernia defects and secured in place with 2-0 Vicryl along the medial superomedial and superolateral aspects.  Then, the peritoneal flap was advanced over the mesh and carried over to close the defect. A running 2-0 V lock suture was used to approximate the edge  of the flap onto the peritoneum.  All needles were removed under direct visualization.  The cord lipoma was removed through one of the 8 mm ports.  The 8- mm ports were removed under direct visualization and the 12 mm  port was removed.  The hernia defect was closed using 0 vicryl sutures.  Local anesthetic was infused in all incisions as well as a right ilioinguinal block.  The umbilical stalk was reattached to the fascia using 2-0 Vicryl sutures.  The umbilical incision was closed in layers using 3-0 Vicryl and 4-0 Monocryl, and the other port incisions were closed with 4-0 Monocryl.  The wounds were cleaned and sealed with DermaBond.  Foley catheter was removed and the patient was emerged from anesthesia and extubated and brought to the recovery room for further management.  The patient tolerated the procedure well and all counts were correct at the end of the case.   Howie Ill, MD

## 2023-07-08 NOTE — Anesthesia Postprocedure Evaluation (Signed)
 Anesthesia Post Note  Patient: Todd Hancock  Procedure(s) Performed: REPAIR, HERNIA, UMBILICAL, ADULT REPAIR, HERNIA, INGUINAL, ROBOT-ASSISTED, LAPAROSCOPIC, USING MESH (Right)  Patient location during evaluation: PACU Anesthesia Type: General Level of consciousness: awake and alert Pain management: pain level controlled Vital Signs Assessment: post-procedure vital signs reviewed and stable Respiratory status: spontaneous breathing, nonlabored ventilation, respiratory function stable and patient connected to nasal cannula oxygen Cardiovascular status: blood pressure returned to baseline and stable Postop Assessment: no apparent nausea or vomiting Anesthetic complications: no   No notable events documented.   Last Vitals:  Vitals:   07/08/23 1031 07/08/23 1036  BP: 135/81 (!) 151/90  Pulse: 68 64  Resp: 17 20  Temp: (!) 36.3 C (!) 36.1 C  SpO2: 100% 98%    Last Pain:  Vitals:   07/08/23 1036  TempSrc: Temporal  PainSc: 0-No pain                 Cleda Mccreedy Jaman Aro

## 2023-07-09 ENCOUNTER — Encounter: Payer: Self-pay | Admitting: Surgery

## 2023-07-15 ENCOUNTER — Telehealth: Payer: Self-pay | Admitting: Surgery

## 2023-07-15 NOTE — Telephone Encounter (Signed)
 Pt has called saying Todd Hancock says they sent you a fax and he is asking for status. Says it just needs to say that he had surgery. Needs for you to call him back. Phone number [phone]. Fax to [facility] number [phone]

## 2023-07-22 ENCOUNTER — Ambulatory Visit (INDEPENDENT_AMBULATORY_CARE_PROVIDER_SITE_OTHER): Admitting: Physician Assistant

## 2023-07-22 ENCOUNTER — Encounter: Payer: Self-pay | Admitting: Physician Assistant

## 2023-07-22 VITALS — BP 153/93 | HR 85 | Ht 72.0 in | Wt 182.0 lb

## 2023-07-22 DIAGNOSIS — K429 Umbilical hernia without obstruction or gangrene: Secondary | ICD-10-CM

## 2023-07-22 DIAGNOSIS — Z09 Encounter for follow-up examination after completed treatment for conditions other than malignant neoplasm: Secondary | ICD-10-CM

## 2023-07-22 DIAGNOSIS — K4090 Unilateral inguinal hernia, without obstruction or gangrene, not specified as recurrent: Secondary | ICD-10-CM

## 2023-07-22 NOTE — Patient Instructions (Signed)

## 2023-07-22 NOTE — Progress Notes (Signed)
 [facility] POST-OP OFFICE VISIT  07/22/2023  HPI: Todd Hancock is a 53 y.o. male 14 days s/p robotic assisted laparoscopic right inguinal hernia repair and open umbilical hernia repair with Dr Aleen Campi.   He is overall doing well Pain was the worst until POD3, then improved 2 days ago, he reports an increase in pain in his right abdomen after rolling over, notices this with certain movements Otherwise, no fever, chills, nausea, emesis, urinary changes, or bowel changes Incisions are healing well Ambulating No other complaints   Vital signs: BP (!) 153/93   Pulse 85   Ht 6' (1.829 m)   Wt 182 lb (82.6 kg)   SpO2 96%   BMI 24.68 kg/m    Physical Exam: Constitutional: Well appearing male, NAD Abdomen: Soft, sore in RLQ consistent with area of dissection intra-operatively, non-distended, no rebound/guarding. No evidence of recurrence  Skin: Laparoscopic incisions are healing well, no erythema or drainage   Assessment/Plan: This is a 53 y.o. male 14 days s/p robotic assisted laparoscopic right inguinal hernia repair and open umbilical hernia repair with Dr Aleen Campi.    - I do feel that his current pain is expected at this stage in the recovery process. No evidence of injury nor recurrence. Continue pain control prn  - Reviewed wound care recommendation  - Reviewed lifting restrictions; 6 weeks total - updated work note  - I will see him again on 04/23 for work clearance; He understands to call with questions/concerns  -- Lynden Oxford, PA-C [facility] 07/22/2023, 2:33 PM M-F: 7am - 4pm

## 2023-08-05 ENCOUNTER — Ambulatory Visit (INDEPENDENT_AMBULATORY_CARE_PROVIDER_SITE_OTHER): Admitting: Physician Assistant

## 2023-08-05 ENCOUNTER — Encounter: Payer: Self-pay | Admitting: Physician Assistant

## 2023-08-05 VITALS — BP 146/92 | HR 87 | Temp 98.1°F | Ht 72.0 in | Wt 174.6 lb

## 2023-08-05 DIAGNOSIS — Z09 Encounter for follow-up examination after completed treatment for conditions other than malignant neoplasm: Secondary | ICD-10-CM

## 2023-08-05 DIAGNOSIS — K429 Umbilical hernia without obstruction or gangrene: Secondary | ICD-10-CM

## 2023-08-05 DIAGNOSIS — K4090 Unilateral inguinal hernia, without obstruction or gangrene, not specified as recurrent: Secondary | ICD-10-CM

## 2023-08-05 NOTE — Progress Notes (Signed)
 [facility] POST-OP OFFICE VISIT  08/05/2023  HPI: Todd Hancock is a 53 y.o. male 1 month s/p robotic assisted laparoscopic right inguinal hernia repair and open umbilical hernia repair with Dr Mauri Sous.   He is doing much better Still with soreness but this is much improved from our last visit He is able to do more without pain No fever, chills, nausea, emesis Incisions have healed without issue No other complaints   Vital signs: BP (!) 146/92   Pulse 87   Temp 98.1 F (36.7 C) (Oral)   Ht 6' (1.829 m)   Wt 174 lb 9.6 oz (79.2 kg)   SpO2 97%   BMI 23.68 kg/m    Physical Exam: Constitutional: Well appearing male, NAD Abdomen: Soft, non-tender, non-distended, no rebound/guarding Skin: Laparoscopic incisions are healing well, no erythema or drainage   Assessment/Plan: This is a 53 y.o. male 1 month s/p robotic assisted laparoscopic right inguinal hernia repair and open umbilical hernia repair with Dr Mauri Sous.    - Pain control prn; OTC modalities   - Reviewed wound care recommendation  - Reviewed lifting restrictions; 6 weeks total - return to work note given   - He can follow up on as needed basis; He understands to call with questions/concerns  -- Apolonio Bay, PA-C [facility] 08/05/2023, 2:38 PM M-F: 7am - 4pm

## 2023-08-05 NOTE — Patient Instructions (Signed)

## 2023-08-26 ENCOUNTER — Ambulatory Visit
Admission: EM | Admit: 2023-08-26 | Discharge: 2023-08-26 | Disposition: A | Discharge status: Home / Self Care | Attending: Family Medicine | Admitting: Family Medicine

## 2023-08-26 DIAGNOSIS — Z9889 Other specified postprocedural states: Secondary | ICD-10-CM | POA: Diagnosis not present

## 2023-08-26 DIAGNOSIS — S81802A Unspecified open wound, left lower leg, initial encounter: Secondary | ICD-10-CM

## 2023-08-26 DIAGNOSIS — S91002A Unspecified open wound, left ankle, initial encounter: Secondary | ICD-10-CM

## 2023-08-26 DIAGNOSIS — S81002A Unspecified open wound, left knee, initial encounter: Secondary | ICD-10-CM

## 2023-08-26 DIAGNOSIS — R109 Unspecified abdominal pain: Secondary | ICD-10-CM | POA: Diagnosis not present

## 2023-08-26 NOTE — ED Triage Notes (Signed)
 Patient states that he had hernia surgery and returned to work Monday. Was sore afterwards so stayed out yesterday and today and needs Dr note for today and tomorrow.

## 2023-08-26 NOTE — Discharge Instructions (Signed)
 Follow up with your surgeon to discuss returning to work and any restrictions.
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
## 2023-08-27 ENCOUNTER — Telehealth: Payer: Self-pay

## 2023-08-27 NOTE — Telephone Encounter (Signed)
 Spoke with the patient and he returned to work on Monday and has had increased pain where his mesh is at his surgical site. Will come in on Monday May 19th to be seen by Dr Mauri Sous due to lingering pain. He was seen at Urgent care on 08/26/23 for this and instructed to return to his surgeon.  Work note written for patient to be out of work until seen by Dr Mauri Sous on Monday.

## 2023-08-27 NOTE — Telephone Encounter (Signed)
 Called patient to schedule office visit based on after hours answering service.  States patient does not feel well and left urgent care this morning.

## 2023-08-27 NOTE — Telephone Encounter (Signed)
 Patient left message with after hours service stating he is needing to extend another week of leave due to his discomfort when returned to work on Monday.  Patient took off due to his discomfort.

## 2023-08-31 ENCOUNTER — Encounter: Payer: Self-pay | Admitting: Surgery

## 2023-08-31 ENCOUNTER — Ambulatory Visit: Admitting: Surgery

## 2023-08-31 VITALS — BP 147/93 | HR 83 | Temp 98.3°F | Ht 72.0 in | Wt 182.4 lb

## 2023-08-31 DIAGNOSIS — K429 Umbilical hernia without obstruction or gangrene: Secondary | ICD-10-CM

## 2023-08-31 DIAGNOSIS — K4090 Unilateral inguinal hernia, without obstruction or gangrene, not specified as recurrent: Secondary | ICD-10-CM

## 2023-08-31 DIAGNOSIS — Z09 Encounter for follow-up examination after completed treatment for conditions other than malignant neoplasm: Secondary | ICD-10-CM

## 2023-08-31 NOTE — Patient Instructions (Signed)
 Hernia, Adult     A hernia happens when an organ or tissue inside your body pushes out through a weak spot in the muscles of your belly. This makes a bulge. The bulge may be: In a scar from surgery. This type of bulge is called an incisional hernia. Near your belly button. This type is called an umbilical hernia. In your groin, which is the area where your leg meets your lower belly. This type is called an inguinal hernia. If you're male, this type could also be in your scrotum. In your upper thigh. This type is called a femoral hernia. Inside your belly. This type is called a hiatal hernia. It happens when your stomach slides above your diaphragm, which is the muscle between your belly and your chest. What are the causes? You may get a hernia if: You lift heavy things. You cough over a long period of time. You have trouble pooping, also called constipation. Trouble pooping can lead to straining. There's a cut from surgery in your belly. You have a problem that's present at birth. There's fluid around your belly. You're male and have a testicle that hasn't moved down into your scrotum. You may be at greater risk for hernia if: You smoke. You're very overweight. What are the signs or symptoms? The main symptom is a bulge, but the bulge may not always be seen. It may grow bigger or be easier to see when you cough or strain, such as when you lift something heavy. If you can push the bulge back into your belly, it may not cause pain. If you can't push it back into your belly, it may lose its blood supply. This can cause: Pain. Fever. Nausea and vomiting. Swelling. Trouble pooping. How is this diagnosed? A hernia may be diagnosed based on your symptoms, medical history, and an exam. Your health care provider may ask you to cough or move in a way that makes the bulge easier to see. You may also have tests done. These may include: X-rays. Ultrasound. CT scan. How is this treated? A  hernia that's small and painless may not need to be treated. A hernia that's large or painful may be treated with surgery. Surgery involves pushing the bulge back into place and fixing the weak area of the muscle or belly. Follow these instructions at home: Activity Try not to strain. You may have to avoid lifting. Ask how much weight you can safely lift. If you lift something heavy, use your leg muscles. Do not use your back muscles to lift. Prevent trouble pooping You may need to take these actions to prevent or treat trouble pooping: Drink enough fluid to keep your pee (urine) pale yellow. Take medicines to help you poop. Eat foods high in fiber. These include beans, whole grains, and fresh fruits and vegetables. General instructions When you cough, try to cough gently. Try to push the bulge back in by very gently pressing on it when you're lying down. Do not try to force it back in if it won't push in easily. If you're overweight, work with your provider to lose weight safely. Do not smoke, vape, or use products with nicotine or tobacco in them. If you need help quitting, talk with your provider. If you're going to have surgery, watch your hernia for changes in shape, size, or color. Tell your provider about any changes. Contact a health care provider if: You get new pain, swelling, or redness near your hernia. You have trouble pooping.  Your poop is harder or larger than normal. You have a fever or chills. You have nausea or vomiting. Your hernia can't be pushed in. Get help right away if: You have belly pain that gets worse. Your hernia: Changes in shape or size. Changes color. Feels hard or hurts when you touch it. These symptoms may be an emergency. Call 911 right away. Do not wait to see if the symptoms will go away. Do not drive yourself to the hospital. This information is not intended to replace advice given to you by your health care provider. Make sure you discuss any  questions you have with your health care provider. Document Revised: 01/01/2023 Document Reviewed: 06/24/2022 Elsevier Patient Education  2024 [employer].

## 2023-08-31 NOTE — Progress Notes (Signed)
 08/31/2023  HPI: Todd Hancock is a 53 y.o. male s/p robotic assisted right inguinal hernia repair and open umbilical hernia repair on 07/08/2023.  Patient presents for follow up.  He returned back to work last week, and he reports that the next day he started having a lot of pain in the right periumbilical area.  This was associated with an area that became swollen and also a bit red.  He reports the area is better now but he's still having some pain.  He had been using ice at first and now ice/heat alternating.  Denies any troubles with the right groin or the umbilicus itself and denies any bulging.  Vital signs: BP (!) 147/93   Pulse 83   Temp 98.3 F (36.8 C) (Oral)   Ht 6' (1.829 m)   Wt 182 lb 6.4 oz (82.7 kg)   SpO2 97%   BMI 24.74 kg/m    Physical Exam: Constitutional:   No acute distress Abdomen: Soft, nondistended, currently with mild soreness to palpation in the right periumbilical area over the rectus muscle.  No evidence of right inguinal hernia recurrence or umbilical hernia recurrence.  Incisions are otherwise well-healed.  Assessment/Plan: This is a 53 y.o. male s/p Robotic assisted right inguinal hernia repair, open umbilical hernia repair.  --The patient reports that his work is very strenuous and does a lot of heavy lifting.  He had gone from not doing much at home to full lifting at work, so I think he probably had a muscle strain or tear that could have lead to a small hematoma.  This is improving now, but I discussed with the patient the need to slowly ramp up activity rather than going from 0 to 100% particular given that he does very heavy lifting.  For now we will give him a work note for last week and this week so he can take it easy and start slowly ramping up again before going back to work next week. - Otherwise follow-up as needed.   Marene Shape, MD [facility]

## 2023-09-08 ENCOUNTER — Encounter: Payer: Self-pay | Admitting: *Deleted

## 2023-09-28 ENCOUNTER — Telehealth: Payer: Self-pay | Admitting: *Deleted

## 2023-09-28 NOTE — Telephone Encounter (Signed)
Faxed FMLA to [facility] at [phone]
